# Patient Record
Sex: Female | Born: 1965 | Race: Black or African American | Hispanic: No | Marital: Married
Health system: Southern US, Community
[De-identification: ages and names within clinical notes are randomized; demographics above are authoritative.]

## PROBLEM LIST (undated history)

## (undated) DIAGNOSIS — E119 Type 2 diabetes mellitus without complications: Secondary | ICD-10-CM

## (undated) DIAGNOSIS — I10 Essential (primary) hypertension: Secondary | ICD-10-CM

## (undated) HISTORY — DX: Essential (primary) hypertension: I10

## (undated) HISTORY — DX: Type 2 diabetes mellitus without complications: E11.9

---

## 2003-10-17 ENCOUNTER — Other Ambulatory Visit: Payer: Self-pay

## 2003-11-11 ENCOUNTER — Other Ambulatory Visit: Payer: Self-pay

## 2003-11-14 ENCOUNTER — Other Ambulatory Visit: Payer: Self-pay

## 2003-12-26 ENCOUNTER — Ambulatory Visit: Payer: Self-pay | Admitting: Internal Medicine

## 2004-10-17 ENCOUNTER — Emergency Department: Payer: Self-pay | Admitting: Unknown Physician Specialty

## 2004-10-20 ENCOUNTER — Ambulatory Visit: Payer: Self-pay | Admitting: Unknown Physician Specialty

## 2004-10-28 ENCOUNTER — Inpatient Hospital Stay: Payer: Self-pay

## 2005-05-27 ENCOUNTER — Ambulatory Visit: Payer: Self-pay

## 2005-07-30 ENCOUNTER — Other Ambulatory Visit: Payer: Self-pay

## 2005-07-30 ENCOUNTER — Observation Stay: Payer: Self-pay | Admitting: Internal Medicine

## 2005-09-06 ENCOUNTER — Emergency Department: Payer: Self-pay | Admitting: Unknown Physician Specialty

## 2006-08-24 ENCOUNTER — Emergency Department: Payer: Self-pay | Admitting: Emergency Medicine

## 2006-08-24 ENCOUNTER — Other Ambulatory Visit: Payer: Self-pay

## 2006-10-17 ENCOUNTER — Emergency Department: Payer: Self-pay

## 2006-11-03 ENCOUNTER — Ambulatory Visit: Payer: Self-pay | Admitting: Internal Medicine

## 2006-12-18 ENCOUNTER — Other Ambulatory Visit: Payer: Self-pay

## 2006-12-18 ENCOUNTER — Emergency Department: Payer: Self-pay | Admitting: Emergency Medicine

## 2006-12-23 ENCOUNTER — Ambulatory Visit: Payer: Self-pay | Admitting: Internal Medicine

## 2007-01-06 ENCOUNTER — Ambulatory Visit: Payer: Self-pay | Admitting: Internal Medicine

## 2007-04-27 ENCOUNTER — Ambulatory Visit: Payer: Self-pay | Admitting: Internal Medicine

## 2007-08-09 ENCOUNTER — Ambulatory Visit: Payer: Self-pay | Admitting: Family Medicine

## 2007-12-01 ENCOUNTER — Ambulatory Visit: Payer: Self-pay | Admitting: Internal Medicine

## 2007-12-16 ENCOUNTER — Ambulatory Visit: Payer: Self-pay | Admitting: Family Medicine

## 2008-02-01 ENCOUNTER — Emergency Department: Payer: Self-pay | Admitting: Emergency Medicine

## 2008-08-09 ENCOUNTER — Emergency Department: Payer: Self-pay | Admitting: Emergency Medicine

## 2009-01-10 ENCOUNTER — Ambulatory Visit: Payer: Self-pay | Admitting: Internal Medicine

## 2009-04-30 ENCOUNTER — Ambulatory Visit: Payer: Self-pay | Admitting: Family Medicine

## 2010-02-10 ENCOUNTER — Ambulatory Visit: Payer: Self-pay | Admitting: Internal Medicine

## 2010-02-14 ENCOUNTER — Ambulatory Visit: Payer: Self-pay | Admitting: Family Medicine

## 2010-08-04 ENCOUNTER — Ambulatory Visit: Payer: Self-pay | Admitting: Specialist

## 2010-08-13 ENCOUNTER — Ambulatory Visit: Payer: Self-pay | Admitting: Specialist

## 2010-10-14 DIAGNOSIS — E119 Type 2 diabetes mellitus without complications: Secondary | ICD-10-CM | POA: Insufficient documentation

## 2010-11-04 DIAGNOSIS — I10 Essential (primary) hypertension: Secondary | ICD-10-CM | POA: Insufficient documentation

## 2010-11-04 DIAGNOSIS — K3189 Other diseases of stomach and duodenum: Secondary | ICD-10-CM | POA: Insufficient documentation

## 2010-11-04 DIAGNOSIS — K449 Diaphragmatic hernia without obstruction or gangrene: Secondary | ICD-10-CM | POA: Insufficient documentation

## 2010-11-04 DIAGNOSIS — E782 Mixed hyperlipidemia: Secondary | ICD-10-CM | POA: Insufficient documentation

## 2010-11-04 DIAGNOSIS — F4541 Pain disorder exclusively related to psychological factors: Secondary | ICD-10-CM | POA: Insufficient documentation

## 2010-11-04 DIAGNOSIS — Z8711 Personal history of peptic ulcer disease: Secondary | ICD-10-CM | POA: Insufficient documentation

## 2011-01-27 ENCOUNTER — Ambulatory Visit: Payer: Self-pay | Admitting: Family Medicine

## 2011-12-01 DIAGNOSIS — E669 Obesity, unspecified: Secondary | ICD-10-CM | POA: Insufficient documentation

## 2012-02-09 ENCOUNTER — Ambulatory Visit: Payer: Self-pay | Admitting: Family Medicine

## 2012-05-16 ENCOUNTER — Emergency Department: Payer: Self-pay | Admitting: Emergency Medicine

## 2012-05-24 ENCOUNTER — Ambulatory Visit: Payer: Self-pay | Admitting: Family Medicine

## 2012-06-08 DIAGNOSIS — M25511 Pain in right shoulder: Secondary | ICD-10-CM | POA: Insufficient documentation

## 2012-06-08 DIAGNOSIS — S46911A Strain of unspecified muscle, fascia and tendon at shoulder and upper arm level, right arm, initial encounter: Secondary | ICD-10-CM | POA: Insufficient documentation

## 2012-10-07 ENCOUNTER — Ambulatory Visit: Payer: Self-pay | Admitting: Family Medicine

## 2012-10-14 ENCOUNTER — Ambulatory Visit: Payer: Self-pay | Admitting: Emergency Medicine

## 2013-05-27 ENCOUNTER — Emergency Department: Payer: Self-pay | Admitting: Emergency Medicine

## 2013-05-27 LAB — COMPREHENSIVE METABOLIC PANEL
Albumin: 3.9 g/dL (ref 3.4–5.0)
Alkaline Phosphatase: 115 U/L
Anion Gap: 5 — ABNORMAL LOW (ref 7–16)
BUN: 13 mg/dL (ref 7–18)
Bilirubin,Total: 0.3 mg/dL (ref 0.2–1.0)
Calcium, Total: 9 mg/dL (ref 8.5–10.1)
Chloride: 104 mmol/L (ref 98–107)
Co2: 30 mmol/L (ref 21–32)
Creatinine: 0.9 mg/dL (ref 0.60–1.30)
EGFR (African American): >60
EGFR (Non-African Amer.): >60
Glucose: 152 mg/dL — ABNORMAL HIGH (ref 65–99)
Osmolality: 281 (ref 275–301)
Potassium: 3.5 mmol/L (ref 3.5–5.1)
SGOT(AST): 19 U/L (ref 15–37)
SGPT (ALT): 21 U/L (ref 12–78)
Sodium: 139 mmol/L (ref 136–145)
Total Protein: 8.5 g/dL — ABNORMAL HIGH (ref 6.4–8.2)

## 2013-05-27 LAB — URINALYSIS, COMPLETE
Bacteria: NONE SEEN
Bilirubin,UR: NEGATIVE
Glucose,UR: 50 mg/dL (ref 0–75)
Ketone: NEGATIVE
Leukocyte Esterase: NEGATIVE
Nitrite: NEGATIVE
Ph: 6 (ref 4.5–8.0)
Protein: NEGATIVE
RBC,UR: 1 /HPF (ref 0–5)
Specific Gravity: 1.01 (ref 1.003–1.030)
Squamous Epithelial: 1
WBC UR: <1 /HPF (ref 0–5)

## 2013-05-27 LAB — CBC
HCT: 36 % (ref 35.0–47.0)
HGB: 11.8 g/dL — ABNORMAL LOW (ref 12.0–16.0)
MCH: 26.7 pg (ref 26.0–34.0)
MCHC: 32.7 g/dL (ref 32.0–36.0)
MCV: 81 fL (ref 80–100)
Platelet: 336 10*3/uL (ref 150–440)
RBC: 4.42 10*6/uL (ref 3.80–5.20)
RDW: 15.4 % — ABNORMAL HIGH (ref 11.5–14.5)
WBC: 12.4 10*3/uL — ABNORMAL HIGH (ref 3.6–11.0)

## 2013-05-27 LAB — LIPASE, BLOOD: Lipase: 136 U/L (ref 73–393)

## 2013-12-28 ENCOUNTER — Ambulatory Visit: Payer: Self-pay | Admitting: Family Medicine

## 2014-02-14 DIAGNOSIS — M25774 Osteophyte, right foot: Secondary | ICD-10-CM

## 2014-02-14 DIAGNOSIS — M79671 Pain in right foot: Secondary | ICD-10-CM

## 2014-02-14 DIAGNOSIS — M2042 Other hammer toe(s) (acquired), left foot: Secondary | ICD-10-CM

## 2014-02-14 DIAGNOSIS — M79672 Pain in left foot: Secondary | ICD-10-CM

## 2014-02-14 DIAGNOSIS — M2041 Other hammer toe(s) (acquired), right foot: Secondary | ICD-10-CM

## 2014-02-14 DIAGNOSIS — D492 Neoplasm of unspecified behavior of bone, soft tissue, and skin: Secondary | ICD-10-CM

## 2014-07-25 IMAGING — CT CT ABD-PELV W/ CM
2 of 5 series · 17 of 46 positions shown, 19 images · IV contrast (isovue)
Comparison: 01/27/2011.

CLINICAL DATA: Abdominal pain with vomiting and diarrhea.

EXAM:
CT ABDOMEN AND PELVIS WITH CONTRAST
TECHNIQUE: Multidetector CT imaging of the abdomen and pelvis was performed
using the standard protocol following bolus administration of
intravenous contrast.
CONTRAST:  125 cc Isovue 370 intravenous

[Series 2: routine abd pel with · axial · 0.69mm/px · z∈[-929,-539]mm · 14 of 88 slices shown, 16 images]
[im 5/88  soft-tissue]
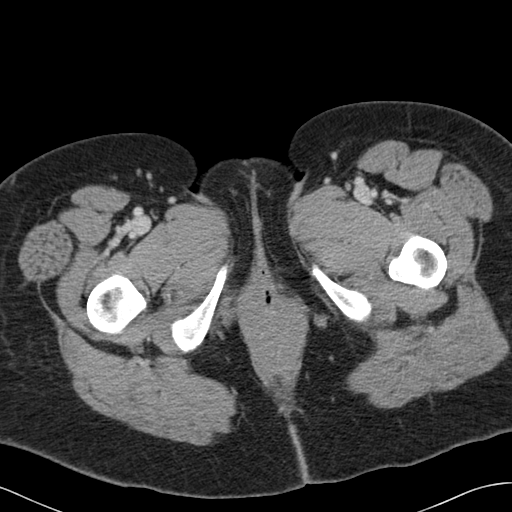
[im 5/88  bone]
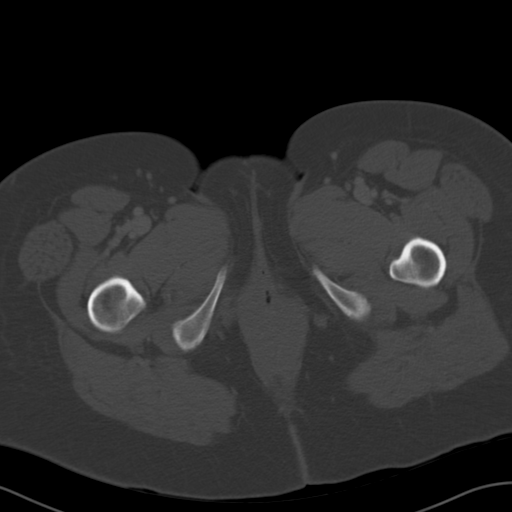
[im 10/88  soft-tissue]
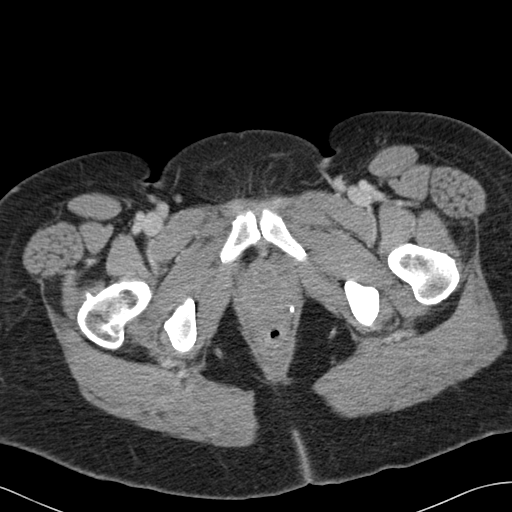
[im 19/88  soft-tissue]
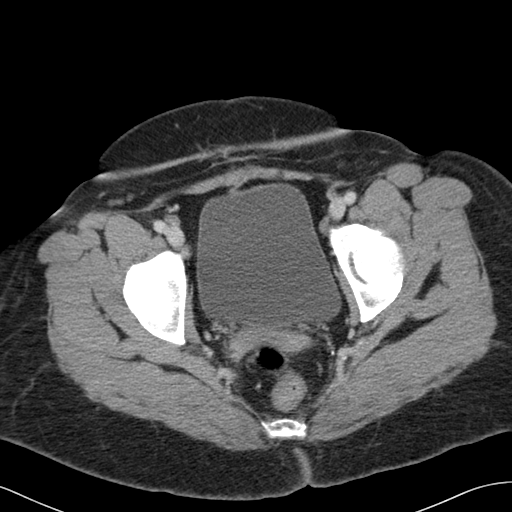
[im 23/88  soft-tissue]
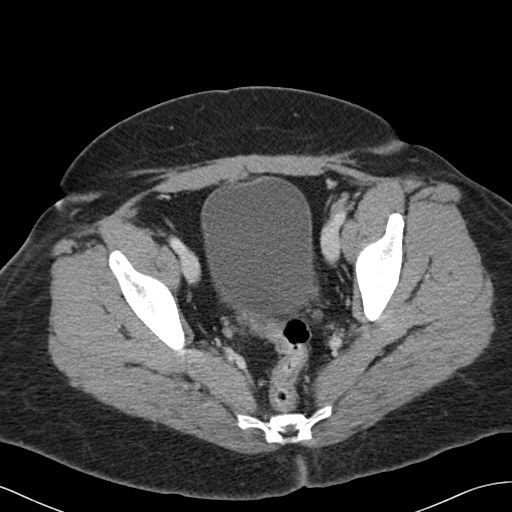
[im 28/88  soft-tissue]
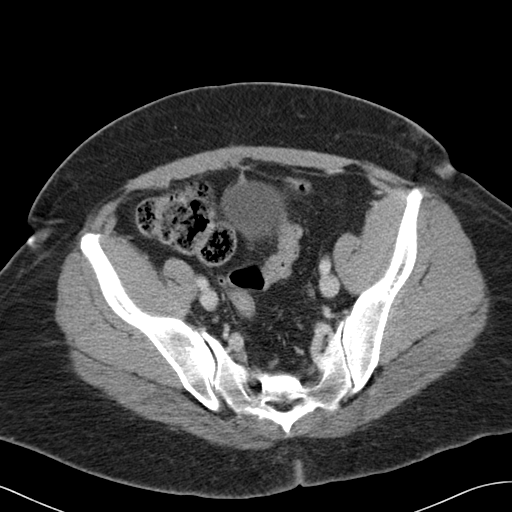
[im 37/88  soft-tissue]
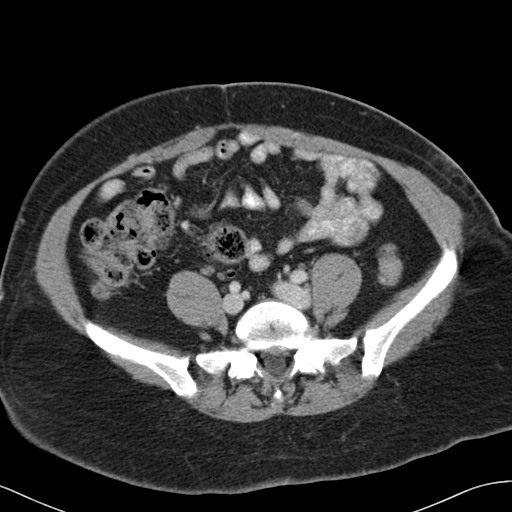
[im 42/88  soft-tissue]
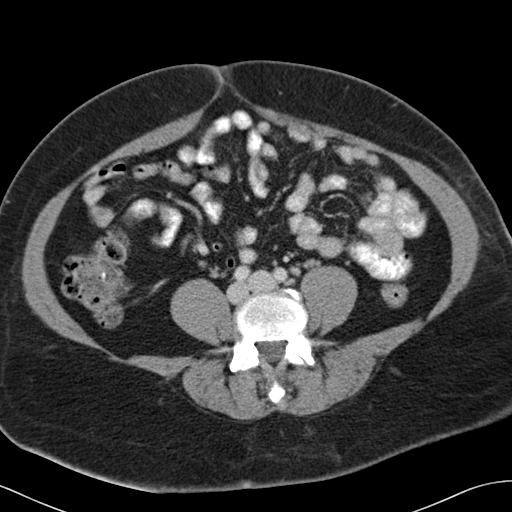
[im 46/88  soft-tissue]
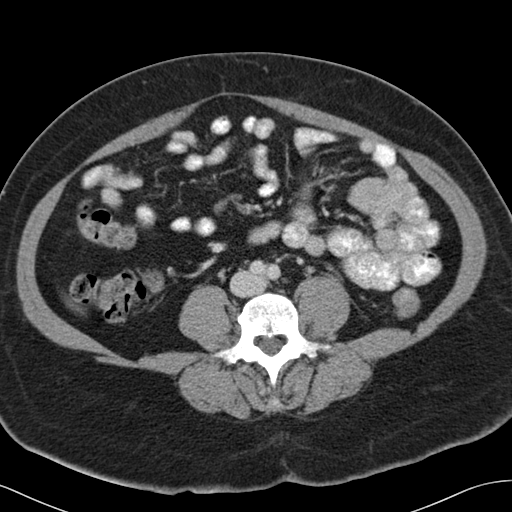
[im 51/88  soft-tissue]
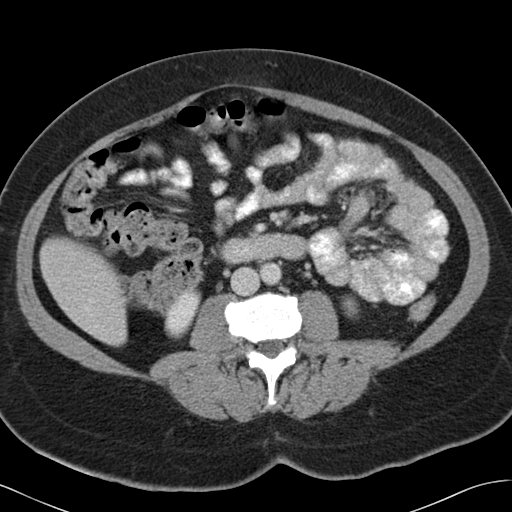
[im 51/88  bone]
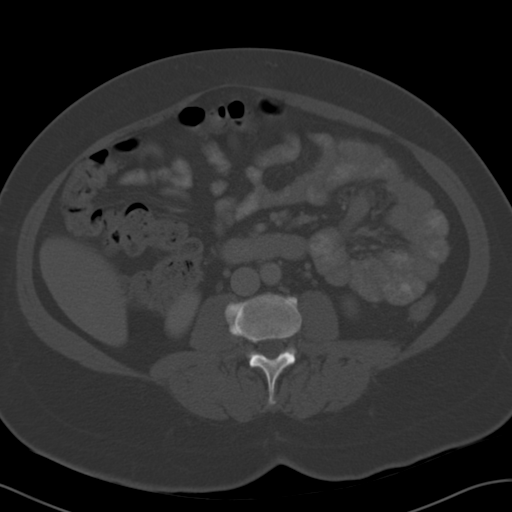
[im 60/88  soft-tissue]
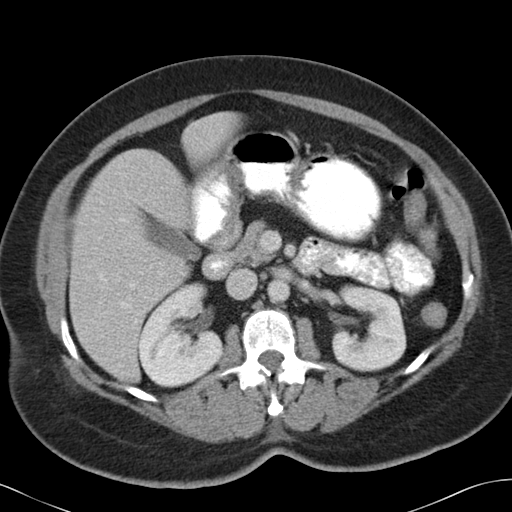
[im 65/88  soft-tissue]
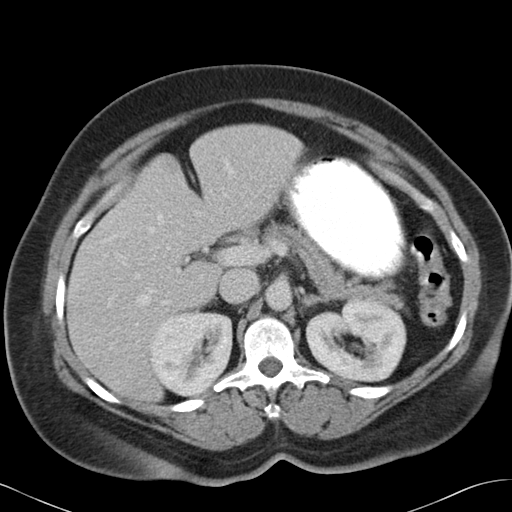
[im 69/88  soft-tissue]
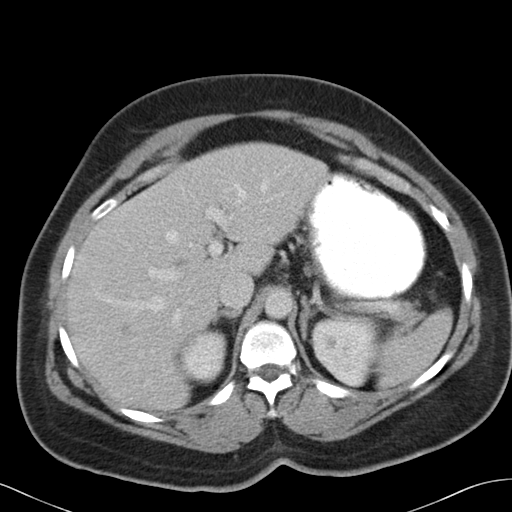
[im 78/88  soft-tissue]
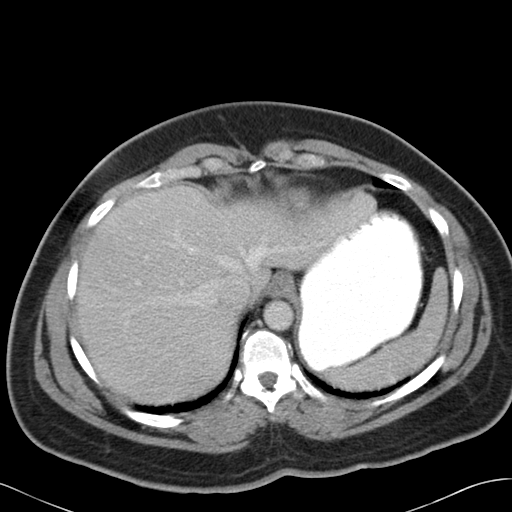
[im 83/88  soft-tissue]
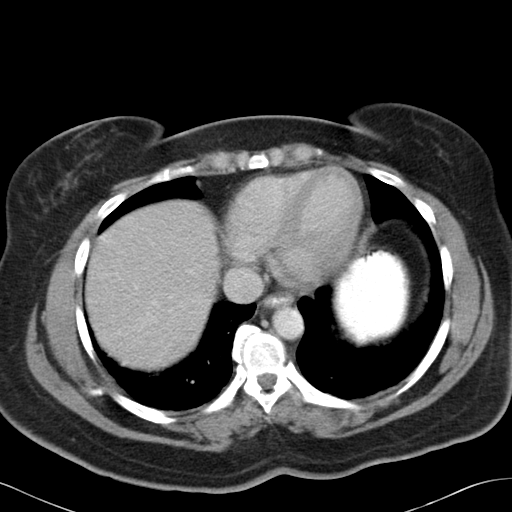

[Series 6: cor routine abd pel with · coronal · 0.75mm/px · 3 of 150 slices shown]
[im 50/150  soft-tissue]
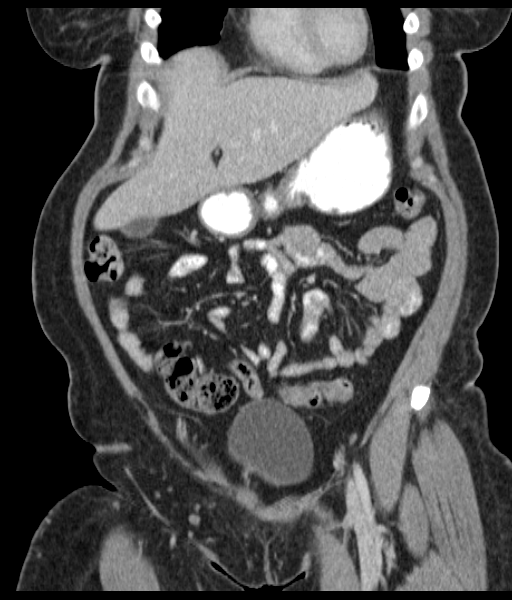
[im 67/150  soft-tissue]
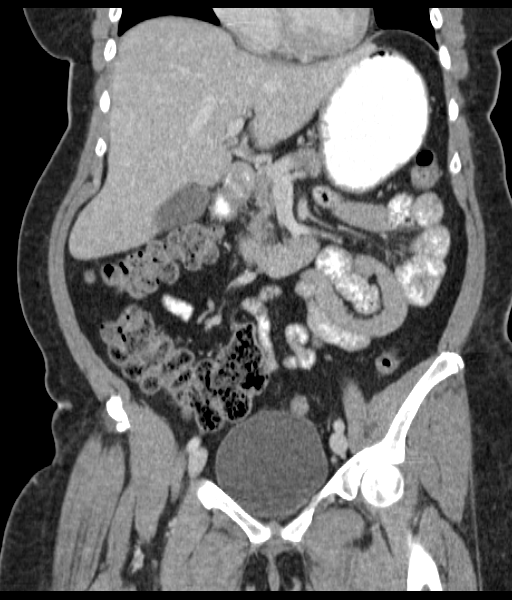
[im 83/150  soft-tissue]
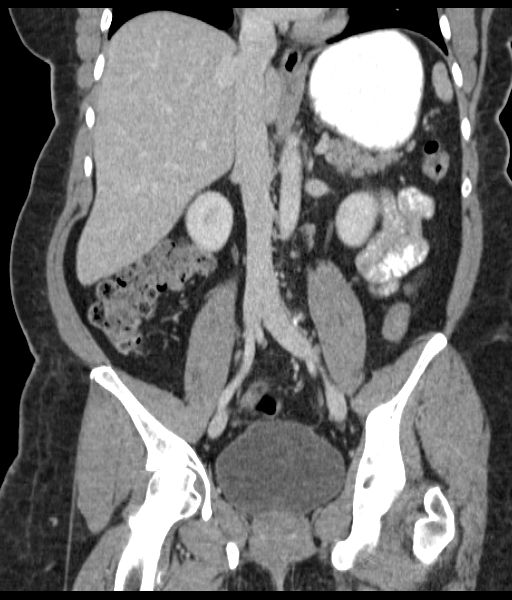

[17 of 46 positions shown; findings below may reference images not displayed]

FINDINGS: BODY WALL: Unremarkable.

LOWER CHEST: 3-4 mm subpleural pulmonary nodule at the peripheral
left base. Given slice selection, this was likely present and
unchanged in 8658.

ABDOMEN/PELVIS:

Liver: A faint, ill-defined nodule in the central right liver (image
20) measuring 8 mm is unchanged from 8658. Stability consistent with
benign finding, often hemangioma.

Biliary: No evidence of biliary obstruction or stone.

Pancreas: Unremarkable.

Spleen: Unremarkable.

Adrenals: Unremarkable.

Kidneys and ureters: No hydronephrosis or stone.

Bladder: Unremarkable.

Reproductive: Hysterectomy.

Bowel: No obstruction. Normal appendix.

Retroperitoneum: Unchanged enlargement of a external iliac node on
the right, 14 mm in diameter. This is likely from remote
inflammation given stability.

Peritoneum: No free fluid or gas.

Vascular: No acute abnormality.

OSSEOUS: No acute abnormalities.
IMPRESSION: No acute intra-abdominal abnormality.

## 2015-09-06 DIAGNOSIS — G5603 Carpal tunnel syndrome, bilateral upper limbs: Secondary | ICD-10-CM | POA: Insufficient documentation

## 2015-12-05 ENCOUNTER — Other Ambulatory Visit: Payer: Self-pay | Admitting: Family Medicine

## 2015-12-05 DIAGNOSIS — Z1231 Encounter for screening mammogram for malignant neoplasm of breast: Secondary | ICD-10-CM

## 2015-12-17 ENCOUNTER — Ambulatory Visit: Admission: RE | Admit: 2015-12-17 | Payer: Self-pay | Source: Ambulatory Visit

## 2016-06-09 DIAGNOSIS — R399 Unspecified symptoms and signs involving the genitourinary system: Secondary | ICD-10-CM | POA: Insufficient documentation

## 2016-06-09 DIAGNOSIS — R109 Unspecified abdominal pain: Secondary | ICD-10-CM | POA: Insufficient documentation

## 2016-06-09 DIAGNOSIS — R7309 Other abnormal glucose: Secondary | ICD-10-CM | POA: Insufficient documentation

## 2016-07-06 DIAGNOSIS — M170 Bilateral primary osteoarthritis of knee: Secondary | ICD-10-CM | POA: Insufficient documentation

## 2017-08-18 ENCOUNTER — Encounter: Payer: Self-pay | Admitting: Endocrinology

## 2019-04-03 ENCOUNTER — Ambulatory Visit: Attending: Family Medicine | Primary: Family Medicine

## 2019-04-17 ENCOUNTER — Ambulatory Visit: Payer: PRIVATE HEALTH INSURANCE | Attending: Family Medicine | Primary: Family Medicine

## 2019-04-17 NOTE — Progress Notes (Signed)
At 254 today I attempted to reach pt for her  VV with Dr. Melchor Amour pt's phone went straight to VM. LVM for pt to [facility] to office. Will re-attempt shortly.

## 2019-04-17 NOTE — Progress Notes (Signed)
At 240 today I attempted to reach pt for her VV with Dr. Melchor Amour pt's phone went straight to VM. LVM for pt to [facility] to office. Will re-attempt shortly.

## 2019-04-17 NOTE — Progress Notes (Signed)
At 254 today I attempted to reach pt for her  VV with Dr. Kluball pt's phone went straight to VM. LVM for pt to [facility] to office. Will re-attempt shortly.

## 2019-04-17 NOTE — Progress Notes (Signed)
At 240 today I attempted to reach pt for her VV with Dr. Kluball pt's phone went straight to VM. LVM for pt to [facility] to office. Will re-attempt shortly.

## 2020-04-10 ENCOUNTER — Other Ambulatory Visit: Payer: Self-pay | Admitting: Family Medicine

## 2020-04-10 DIAGNOSIS — Z1231 Encounter for screening mammogram for malignant neoplasm of breast: Secondary | ICD-10-CM

## 2020-04-15 ENCOUNTER — Other Ambulatory Visit: Payer: Self-pay

## 2020-04-15 ENCOUNTER — Telehealth (INDEPENDENT_AMBULATORY_CARE_PROVIDER_SITE_OTHER): Payer: Self-pay | Admitting: Gastroenterology

## 2020-04-15 DIAGNOSIS — I10 Essential (primary) hypertension: Secondary | ICD-10-CM | POA: Insufficient documentation

## 2020-04-15 DIAGNOSIS — Z1211 Encounter for screening for malignant neoplasm of colon: Secondary | ICD-10-CM

## 2020-04-15 MED ORDER — PEG 3350-KCL-NA BICARB-NACL 420 G PO SOLR: 4000.0000 mL | Freq: Once | ORAL | 0 refills | Status: AC

## 2020-04-15 NOTE — Progress Notes (Signed)
Gastroenterology Pre-Procedure Review  Request Date: Friday 05/10/20 Requesting Physician: Dr. Bonna Gains  PATIENT REVIEW QUESTIONS: The patient responded to the following health history questions as indicated:    1. Are you having any GI issues? yes (heartburn) 2. Do you have a personal history of Polyps? yes (pt states her last colonoscopy was 10-15 years ago) 3. Do you have a family history of Colon Cancer or Polyps? no 4. Diabetes Mellitus? yes (type 2) 5. Joint replacements in the past 12 months?no 6. Major health problems in the past 3 months?no 7. Any artificial heart valves, MVP, or defibrillator?no    MEDICATIONS & ALLERGIES:    Patient reports the following regarding taking any anticoagulation/antiplatelet therapy:   Plavix, Coumadin, Eliquis, Xarelto, Lovenox, Pradaxa, Brilinta, or Effient? no Aspirin? no  Patient confirms/reports the following medications:  Current Outpatient Medications  Medication Sig Dispense Refill  . Blood Glucose Monitoring Suppl (FIFTY50 GLUCOSE METER 2.0) w/Device KIT See admin instructions.    Marland Kitchen CARDIZEM CD 240 MG 24 hr capsule Take 240 mg by mouth daily.    Marland Kitchen esomeprazole (NEXIUM) 40 MG capsule Take by mouth.    Marland Kitchen HUMALOG KWIKPEN 100 UNIT/ML KwikPen SMARTSIG:10 Unit(s) SUB-Q 3 Times Daily    . hydrochlorothiazide (HYDRODIURIL) 12.5 MG tablet Take by mouth.    Marland Kitchen ibuprofen (ADVIL) 800 MG tablet Take 800 mg by mouth 3 (three) times daily as needed.    . Insulin Aspart FlexPen 100 UNIT/ML SOPN SMARTSIG:10 Unit(s) SUB-Q    . insulin aspart protamine- aspart (NOVOLOG MIX 70/30) (70-30) 100 UNIT/ML injection Inject into the skin.    . Insulin Pen Needle (B-D ULTRAFINE III SHORT PEN) 31G X 8 MM MISC Inject insulin qac and qhs    . Ipratropium-Albuterol (COMBIVENT RESPIMAT) 20-100 MCG/ACT AERS respimat INHALE 1 INHALATION INTO THE LUNGS FOUR TIMES DAILY AS NEEDED FOR WHEEZING    . lisinopril-hydrochlorothiazide (ZESTORETIC) 20-12.5 MG tablet Take 1 tablet  by mouth daily.    . ondansetron (ZOFRAN-ODT) 4 MG disintegrating tablet Take by mouth.    . polyethylene glycol-electrolytes (NULYTELY) 420 g solution Take 4,000 mLs by mouth once for 1 dose. 4000 mL 0  . tamsulosin (FLOMAX) 0.4 MG CAPS capsule Take by mouth.    . topiramate (TOPAMAX) 50 MG tablet TAKE 1 TABLET BY MOUTH TWICE DAILY. NEED APPT FOR MORE REFILLS PER MD    . traMADol (ULTRAM) 50 MG tablet      No current facility-administered medications for this visit.    Patient confirms/reports the following allergies:  Allergies  Allergen Reactions  . Oxycodone-Acetaminophen Hives  . Morphine   . Sulfa Antibiotics Other (See Comments)    Other reaction(s): Blood Disorder   . Vancomycin Itching    No orders of the defined types were placed in this encounter.   AUTHORIZATION INFORMATION Primary Insurance: 1D#: Group #:  Secondary Insurance: 1D#: Group #:  SCHEDULE INFORMATION: Date:05/10/20  Time: Location:[facility]

## 2020-05-08 ENCOUNTER — Other Ambulatory Visit: Admission: RE | Admit: 2020-05-08 | Payer: Self-pay | Source: Ambulatory Visit

## 2020-05-09 ENCOUNTER — Telehealth: Payer: Self-pay

## 2020-05-09 NOTE — Telephone Encounter (Signed)
Called patient to inform her procedure has been cancelled. Pt needed to cancel because of family emergency. Endo unit has been notified.

## 2020-05-10 ENCOUNTER — Encounter: Admission: RE | Payer: Self-pay | Source: Home / Self Care

## 2020-05-10 ENCOUNTER — Ambulatory Visit
Admission: RE | Admit: 2020-05-10 | Payer: BC Managed Care – PPO | Source: Home / Self Care | Admitting: Gastroenterology

## 2020-05-10 SURGERY — COLONOSCOPY WITH PROPOFOL
Anesthesia: General

## 2020-09-23 ENCOUNTER — Emergency Department (HOSPITAL_COMMUNITY): Payer: Self-pay

## 2020-09-23 ENCOUNTER — Observation Stay (HOSPITAL_COMMUNITY)
Admission: EM | Admit: 2020-09-23 | Discharge: 2020-09-24 | Disposition: A | Discharge status: Home / Self Care | Payer: Self-pay | Attending: Internal Medicine | Admitting: Internal Medicine

## 2020-09-23 ENCOUNTER — Other Ambulatory Visit: Payer: Self-pay

## 2020-09-23 ENCOUNTER — Encounter (HOSPITAL_COMMUNITY): Payer: Self-pay

## 2020-09-23 DIAGNOSIS — E119 Type 2 diabetes mellitus without complications: Secondary | ICD-10-CM | POA: Insufficient documentation

## 2020-09-23 DIAGNOSIS — Y92009 Unspecified place in unspecified non-institutional (private) residence as the place of occurrence of the external cause: Secondary | ICD-10-CM | POA: Insufficient documentation

## 2020-09-23 DIAGNOSIS — R739 Hyperglycemia, unspecified: Secondary | ICD-10-CM

## 2020-09-23 DIAGNOSIS — R413 Other amnesia: Secondary | ICD-10-CM

## 2020-09-23 DIAGNOSIS — L0211 Cutaneous abscess of neck: Secondary | ICD-10-CM | POA: Insufficient documentation

## 2020-09-23 DIAGNOSIS — S060X9A Concussion with loss of consciousness of unspecified duration, initial encounter: Secondary | ICD-10-CM

## 2020-09-23 DIAGNOSIS — R748 Abnormal levels of other serum enzymes: Secondary | ICD-10-CM | POA: Insufficient documentation

## 2020-09-23 DIAGNOSIS — S0990XA Unspecified injury of head, initial encounter: Principal | ICD-10-CM | POA: Insufficient documentation

## 2020-09-23 DIAGNOSIS — Z794 Long term (current) use of insulin: Secondary | ICD-10-CM | POA: Insufficient documentation

## 2020-09-23 DIAGNOSIS — R55 Syncope and collapse: Secondary | ICD-10-CM | POA: Insufficient documentation

## 2020-09-23 DIAGNOSIS — I10 Essential (primary) hypertension: Secondary | ICD-10-CM | POA: Diagnosis present

## 2020-09-23 DIAGNOSIS — E1169 Type 2 diabetes mellitus with other specified complication: Secondary | ICD-10-CM

## 2020-09-23 DIAGNOSIS — W19XXXA Unspecified fall, initial encounter: Secondary | ICD-10-CM | POA: Diagnosis present

## 2020-09-23 DIAGNOSIS — Z20822 Contact with and (suspected) exposure to covid-19: Secondary | ICD-10-CM | POA: Insufficient documentation

## 2020-09-23 DIAGNOSIS — E1165 Type 2 diabetes mellitus with hyperglycemia: Secondary | ICD-10-CM

## 2020-09-23 DIAGNOSIS — Z79899 Other long term (current) drug therapy: Secondary | ICD-10-CM | POA: Insufficient documentation

## 2020-09-23 DIAGNOSIS — IMO0002 Reserved for concepts with insufficient information to code with codable children: Secondary | ICD-10-CM

## 2020-09-23 DIAGNOSIS — W1642XA Fall into unspecified water causing other injury, initial encounter: Secondary | ICD-10-CM | POA: Insufficient documentation

## 2020-09-23 DIAGNOSIS — S0191XA Laceration without foreign body of unspecified part of head, initial encounter: Secondary | ICD-10-CM | POA: Insufficient documentation

## 2020-09-23 LAB — RESP PANEL BY RT-PCR (FLU A&B, COVID) ARPGX2
Influenza A by PCR: NEGATIVE
Influenza B by PCR: NEGATIVE
SARS Coronavirus 2 by RT PCR: NEGATIVE

## 2020-09-23 LAB — URINALYSIS, ROUTINE W REFLEX MICROSCOPIC
Bilirubin Urine: NEGATIVE
Glucose, UA: >=500 mg/dL — AB
Hgb urine dipstick: NEGATIVE
Ketones, ur: NEGATIVE mg/dL
Leukocytes,Ua: NEGATIVE
Nitrite: POSITIVE — AB
Protein, ur: NEGATIVE mg/dL
Specific Gravity, Urine: 1.022 (ref 1.005–1.030)
pH: 8 (ref 5.0–8.0)

## 2020-09-23 LAB — CBC WITH DIFFERENTIAL/PLATELET
Abs Immature Granulocytes: 0.06 10*3/uL (ref 0.00–0.07)
Basophils Absolute: 0 10*3/uL (ref 0.0–0.1)
Basophils Relative: 1 %
Eosinophils Absolute: 0.1 10*3/uL (ref 0.0–0.5)
Eosinophils Relative: 1 %
HCT: 35.1 % — ABNORMAL LOW (ref 36.0–46.0)
Hemoglobin: 11.3 g/dL — ABNORMAL LOW (ref 12.0–15.0)
Immature Granulocytes: 1 %
Lymphocytes Relative: 29 %
Lymphs Abs: 2.5 10*3/uL (ref 0.7–4.0)
MCH: 27.8 pg (ref 26.0–34.0)
MCHC: 32.2 g/dL (ref 30.0–36.0)
MCV: 86.5 fL (ref 80.0–100.0)
Monocytes Absolute: 0.4 10*3/uL (ref 0.1–1.0)
Monocytes Relative: 5 %
Neutro Abs: 5.6 10*3/uL (ref 1.7–7.7)
Neutrophils Relative %: 63 %
Platelets: 327 10*3/uL (ref 150–400)
RBC: 4.06 MIL/uL (ref 3.87–5.11)
RDW: 13.1 % (ref 11.5–15.5)
WBC: 8.7 10*3/uL (ref 4.0–10.5)
nRBC: 0 % (ref 0.0–0.2)

## 2020-09-23 LAB — COMPREHENSIVE METABOLIC PANEL
ALT: 13 U/L (ref 0–44)
AST: 13 U/L — ABNORMAL LOW (ref 15–41)
Albumin: 3.5 g/dL (ref 3.5–5.0)
Alkaline Phosphatase: 77 U/L (ref 38–126)
Anion gap: 7 (ref 5–15)
BUN: 9 mg/dL (ref 6–20)
CO2: 28 mmol/L (ref 22–32)
Calcium: 8.8 mg/dL — ABNORMAL LOW (ref 8.9–10.3)
Chloride: 101 mmol/L (ref 98–111)
Creatinine, Ser: 0.64 mg/dL (ref 0.44–1.00)
GFR, Estimated: >60 mL/min (ref >60)
Glucose, Bld: 377 mg/dL — ABNORMAL HIGH (ref 70–99)
Potassium: 3.9 mmol/L (ref 3.5–5.1)
Sodium: 136 mmol/L (ref 135–145)
Total Bilirubin: 0.7 mg/dL (ref 0.3–1.2)
Total Protein: 7.1 g/dL (ref 6.5–8.1)

## 2020-09-23 LAB — RAPID URINE DRUG SCREEN, HOSP PERFORMED
Amphetamines: NOT DETECTED
Barbiturates: NOT DETECTED
Benzodiazepines: NOT DETECTED
Cocaine: NOT DETECTED
Opiates: NOT DETECTED
Tetrahydrocannabinol: NOT DETECTED

## 2020-09-23 LAB — TROPONIN I (HIGH SENSITIVITY)
Troponin I (High Sensitivity): 10 ng/L (ref <18)
Troponin I (High Sensitivity): 18 ng/L — ABNORMAL HIGH (ref <18)

## 2020-09-23 LAB — CBG MONITORING, ED: Glucose-Capillary: 301 mg/dL — ABNORMAL HIGH (ref 70–99)

## 2020-09-23 MED ORDER — POLYETHYLENE GLYCOL 3350 17 G PO PACK: 17.0000 g | PACK | Freq: Every day | ORAL | Status: DC | PRN

## 2020-09-23 MED ORDER — TOPIRAMATE 25 MG PO TABS: 25.0000 mg | ORAL_TABLET | Freq: Two times a day (BID) | ORAL | Status: DC

## 2020-09-23 MED ORDER — ENOXAPARIN SODIUM 40 MG/0.4ML IJ SOSY
40.0000 mg | PREFILLED_SYRINGE | INTRAMUSCULAR | Status: DC
  Administered 2020-09-23: 40 mg via SUBCUTANEOUS
  Filled 2020-09-23: qty 0.4

## 2020-09-23 MED ORDER — TRAMADOL HCL 50 MG PO TABS
50.0000 mg | ORAL_TABLET | Freq: Four times a day (QID) | ORAL | Status: DC | PRN
  Administered 2020-09-23 – 2020-09-24 (×2): 50 mg via ORAL
  Filled 2020-09-23 (×2): qty 1

## 2020-09-23 MED ORDER — DILTIAZEM HCL ER COATED BEADS 120 MG PO CP24
120.0000 mg | ORAL_CAPSULE | Freq: Every day | ORAL | Status: DC
  Administered 2020-09-23 – 2020-09-24 (×2): 120 mg via ORAL
  Filled 2020-09-23 (×2): qty 1

## 2020-09-23 MED ORDER — IBUPROFEN 100 MG PO CHEW
100.0000 mg | CHEWABLE_TABLET | Freq: Three times a day (TID) | ORAL | Status: DC | PRN
  Filled 2020-09-23: qty 1

## 2020-09-23 MED ORDER — LISINOPRIL-HYDROCHLOROTHIAZIDE 20-12.5 MG PO TABS: 1.0000 | ORAL_TABLET | Freq: Every day | ORAL | Status: DC

## 2020-09-23 MED ORDER — ACETAMINOPHEN 325 MG PO TABS: 650.0000 mg | ORAL_TABLET | Freq: Four times a day (QID) | ORAL | Status: DC | PRN

## 2020-09-23 MED ORDER — SODIUM CHLORIDE 0.9 % IV SOLN
1.0000 g | INTRAVENOUS | Status: DC
  Administered 2020-09-23: 1 g via INTRAVENOUS
  Filled 2020-09-23 (×2): qty 10

## 2020-09-23 MED ORDER — ONDANSETRON HCL 4 MG/2ML IJ SOLN: 4.0000 mg | Freq: Four times a day (QID) | INTRAMUSCULAR | Status: DC | PRN

## 2020-09-23 MED ORDER — IBUPROFEN 200 MG PO TABS
100.0000 mg | ORAL_TABLET | Freq: Three times a day (TID) | ORAL | Status: DC | PRN
  Administered 2020-09-23 – 2020-09-24 (×2): 100 mg via ORAL
  Filled 2020-09-23 (×2): qty 1

## 2020-09-23 MED ORDER — HYDROCHLOROTHIAZIDE 12.5 MG PO CAPS: 12.5000 mg | ORAL_CAPSULE | Freq: Every day | ORAL | Status: DC

## 2020-09-23 MED ORDER — KETOROLAC TROMETHAMINE 30 MG/ML IJ SOLN
15.0000 mg | Freq: Once | INTRAMUSCULAR | Status: AC
  Administered 2020-09-23: 15 mg via INTRAVENOUS
  Filled 2020-09-23: qty 1

## 2020-09-23 MED ORDER — INSULIN ASPART 100 UNIT/ML IJ SOLN
0.0000 [IU] | Freq: Three times a day (TID) | INTRAMUSCULAR | Status: DC
  Administered 2020-09-24 (×2): 3 [IU] via SUBCUTANEOUS

## 2020-09-23 MED ORDER — LACTATED RINGERS IV SOLN: INTRAVENOUS | Status: DC

## 2020-09-23 MED ORDER — INSULIN GLARGINE 100 UNIT/ML ~~LOC~~ SOLN
10.0000 [IU] | Freq: Every day | SUBCUTANEOUS | Status: DC
  Administered 2020-09-24: 10 [IU] via SUBCUTANEOUS
  Filled 2020-09-23 (×2): qty 0.1

## 2020-09-23 MED ORDER — METOPROLOL TARTRATE 5 MG/5ML IV SOLN: 2.5000 mg | Freq: Four times a day (QID) | INTRAVENOUS | Status: DC | PRN

## 2020-09-23 MED ORDER — INSULIN ASPART 100 UNIT/ML IJ SOLN
0.0000 [IU] | Freq: Every day | INTRAMUSCULAR | Status: DC
  Administered 2020-09-23: 4 [IU] via SUBCUTANEOUS

## 2020-09-23 MED ORDER — MELATONIN 3 MG PO TABS
3.0000 mg | ORAL_TABLET | Freq: Every evening | ORAL | Status: DC | PRN
  Administered 2020-09-23: 3 mg via ORAL
  Filled 2020-09-23: qty 1

## 2020-09-23 MED ORDER — MORPHINE SULFATE (PF) 2 MG/ML IV SOLN: 2.0000 mg | INTRAVENOUS | Status: DC | PRN

## 2020-09-23 MED ORDER — SODIUM CHLORIDE 0.9 % IV BOLUS
500.0000 mL | Freq: Once | INTRAVENOUS | Status: AC
  Administered 2020-09-23: 500 mL via INTRAVENOUS

## 2020-09-23 MED ORDER — DILTIAZEM HCL ER COATED BEADS 240 MG PO CP24: 240.0000 mg | ORAL_CAPSULE | Freq: Every day | ORAL | Status: DC

## 2020-09-23 MED ORDER — LISINOPRIL 20 MG PO TABS
20.0000 mg | ORAL_TABLET | Freq: Every day | ORAL | Status: DC
  Administered 2020-09-23 – 2020-09-24 (×2): 20 mg via ORAL
  Filled 2020-09-23 (×2): qty 1

## 2020-09-23 MED ORDER — OXYCODONE HCL 5 MG PO TABS: 5.0000 mg | ORAL_TABLET | Freq: Four times a day (QID) | ORAL | Status: DC | PRN

## 2020-09-23 NOTE — ED Notes (Signed)
Patient transported to CT 

## 2020-09-23 NOTE — ED Notes (Addendum)
She CAN tell me her name and what season we are in. She cannot tell me what happened today, her home address, how tall she is, how much she weighs, or where she is. After telling her that she is in the hospital and then asking again where she was she was able to say that she is in the hospital.

## 2020-09-23 NOTE — ED Triage Notes (Signed)
BIB EMS from home after a fall today, she cannot remember anything after the fall, unknown last known well time. C/o nausea and vomiting and head pain.

## 2020-09-23 NOTE — ED Notes (Signed)
Pt c/o headache at this time and is asking for something stronger for the pain. Message sent to the provider

## 2020-09-23 NOTE — H&P (Addendum)
History and Physical  Claire Carrillo DXI:[id number] DOB: October 30, 1965 DOA: 09/23/2020  Referring physician: Dr. Alvino Chapel, [place] PCP: Marguerita Merles, MD  Outpatient Specialists: Nephrology Patient coming from: Home.   Chief Complaint: Fall  HPI: Claire Carrillo is a 55 y.o. female with medical history significant for hypertension, GERD, type 2 diabetes, migraines, neck abscess status post incision and drainage by general surgery in 2020, who presented to [facility] ED from home after a fall.  She reports she slipped on water, fell and landed on the back of her head.  She is alert and oriented x 4 at the time of this visit.  The fall was unwitnessed.  She was in her usual state of health prior to this.  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  She lives with a roommate.  In the ED she reports having a headache.  CT head and cervical spine nonacute.  EDP requested admission for observation.  UA positive for nitrite.  CXR non acute.  ED Course:  Temperature 98.2.  BP 142/79, pulse 102, respiratory 21.  Oxygen saturation 98% on room air.  Lab studies remarkable for serum bicarb 28, glucose 377, BUN 9, creatinine 0.64, GFR greater than 60.  WBC 8.7, hemoglobin 11.3, MCV 86, platelet count 327.  UA positive for glucosuria and nitrite.  Review of Systems: Review of systems as noted in the HPI. All other systems reviewed and are negative.  Past medical history Essential hypertension GERD, type 2 diabetes Migraines Neck abscess status post I&D in 2020  Past surgical history: Incision and drainage of neck abscess in 2020  Social History:  has no history on file for tobacco use, alcohol use, and drug use.   Allergies  Allergen Reactions   Oxycodone-Acetaminophen Hives   Morphine    Sulfa Antibiotics Other (See Comments)    Other reaction(s): Blood Disorder    Vancomycin Itching    Family history: Mother with history of type 2 diabetes and  hypertension Father with history of type 2 diabetes and hypertension.  Prior to Admission medications   Medication Sig Start Date End Date Taking? Authorizing Provider  Blood Glucose Monitoring Suppl (FIFTY50 GLUCOSE METER 2.0) w/Device KIT See admin instructions. 12/18/15   [provider]  CARDIZEM CD 240 MG 24 hr capsule Take 240 mg by mouth daily. 04/09/20   [provider]  esomeprazole (NEXIUM) 40 MG capsule Take by mouth. 05/06/17   [provider]  HUMALOG KWIKPEN 100 UNIT/ML KwikPen SMARTSIG:10 Unit(s) SUB-Q 3 Times Daily 04/09/20   [provider]  hydrochlorothiazide (HYDRODIURIL) 12.5 MG tablet Take by mouth.    [provider]  ibuprofen (ADVIL) 800 MG tablet Take 800 mg by mouth 3 (three) times daily as needed. 11/28/19   [provider]  Insulin Aspart FlexPen 100 UNIT/ML SOPN SMARTSIG:10 Unit(s) SUB-Q 01/02/20   [provider]  insulin aspart protamine- aspart (NOVOLOG MIX 70/30) (70-30) 100 UNIT/ML injection Inject into the skin. 05/02/18   [provider]  Insulin Pen Needle (B-D ULTRAFINE III SHORT PEN) 31G X 8 MM MISC Inject insulin qac and qhs 07/15/16   [provider]  Ipratropium-Albuterol (COMBIVENT RESPIMAT) 20-100 MCG/ACT AERS respimat INHALE 1 INHALATION INTO THE LUNGS FOUR TIMES DAILY AS NEEDED FOR WHEEZING 07/12/17   [provider]  lisinopril-hydrochlorothiazide (ZESTORETIC) 20-12.5 MG tablet Take 1 tablet by mouth daily. 04/09/20   [provider]  ondansetron (ZOFRAN-ODT) 4 MG disintegrating tablet  Take by mouth. 08/19/17   [provider]  tamsulosin (FLOMAX) 0.4 MG CAPS capsule Take by mouth. 05/06/17   [provider]  topiramate (TOPAMAX) 50 MG tablet TAKE 1 TABLET BY MOUTH TWICE DAILY. NEED APPT FOR MORE REFILLS PER MD 06/29/17   [provider]  traMADol Veatrice Bourbon) 50 MG tablet  02/14/14   [provider]    Physical Exam: BP (!) 142/79    Pulse (!) 102   Temp 98.2 F (36.8 C) (Oral)   Resp (!) 21   Ht 5' 6"  (1.676 m)   Wt 90.7 kg   SpO2 98%   BMI 32.28 kg/m   General: 55 y.o. year-old female well developed well nourished in no acute distress.  Alert and oriented x3. Cardiovascular: Regular rate and rhythm with no rubs or gallops.  No thyromegaly or JVD noted.  No lower extremity edema. 2/4 pulses in all 4 extremities. Respiratory: Clear to auscultation with no wheezes or rales. Good inspiratory effort. Abdomen: Soft nontender nondistended with normal bowel sounds x4 quadrants. Muskuloskeletal: No cyanosis, clubbing or edema noted bilaterally Neuro: CN II-XII intact, strength, sensation, reflexes Skin: No ulcerative lesions noted or rashes Psychiatry: Judgement and insight appear normal. Mood is appropriate for condition and setting          Labs on Admission:  Basic Metabolic Panel: Recent Labs  Lab 09/23/20 1548  NA 136  K 3.9  CL 101  CO2 28  GLUCOSE 377*  BUN 9  CREATININE 0.64  CALCIUM 8.8*   Liver Function Tests: Recent Labs  Lab 09/23/20 1548  AST 13*  ALT 13  ALKPHOS 77  BILITOT 0.7  PROT 7.1  ALBUMIN 3.5   No results for input(s): LIPASE, AMYLASE in the last 168 hours. No results for input(s): AMMONIA in the last 168 hours. CBC: Recent Labs  Lab 09/23/20 1548  WBC 8.7  NEUTROABS 5.6  HGB 11.3*  HCT 35.1*  MCV 86.5  PLT 327   Cardiac Enzymes: No results for input(s): CKTOTAL, CKMB, CKMBINDEX, TROPONINI in the last 168 hours.  BNP (last 3 results) No results for input(s): BNP in the last 8760 hours.  ProBNP (last 3 results) No results for input(s): PROBNP in the last 8760 hours.  CBG: No results for input(s): GLUCAP in the last 168 hours.  Radiological Exams on Admission: CT Head Wo Contrast  Result Date: 09/23/2020 CLINICAL DATA:  Fall, altered mental status, midline cervical tenderness EXAM: CT HEAD WITHOUT CONTRAST CT CERVICAL SPINE WITHOUT CONTRAST TECHNIQUE:  Multidetector CT imaging of the head and cervical spine was performed following the standard protocol without intravenous contrast. Multiplanar CT image reconstructions of the cervical spine were also generated. COMPARISON:  05/16/2012, 10/18/2006 FINDINGS: CT HEAD FINDINGS Brain: No evidence of acute infarction, hemorrhage, hydrocephalus, extra-axial collection or mass lesion/mass effect. Vascular: No hyperdense vessel or unexpected calcification. Skull: Normal. Negative for fracture or focal lesion. Sinuses/Orbits: No acute finding. Other: Negative for scalp hematoma. CT CERVICAL SPINE FINDINGS Alignment: Facet joints are aligned without dislocation or traumatic listhesis. Dens and lateral masses are aligned. Skull base and vertebrae: No acute fracture. No primary bone lesion or focal pathologic process. Soft tissues and spinal canal: No prevertebral fluid or swelling. No visible canal hematoma. Disc levels: Mild disc height loss and endplate spurring most pronounced at the C6-7 level. Relatively mild facet arthropathy within the cervical spine. Upper chest: Included lung apices are clear. Other: Area of soft tissue nodularity involving the cutaneous and subcutaneous soft tissues  of the posterior neck, left paramidline, at approximately the C4-5 level measuring 1.3 x 0.9 cm (series 5, image 45). IMPRESSION: 1. No acute intracranial findings. 2. No evidence of acute fracture or traumatic listhesis of the cervical spine. 3. Mild cervical spondylosis. 4. Nonspecific area of soft tissue nodularity involving the cutaneous and subcutaneous soft tissues of the posterior neck, left paramidline, at approximately the C4-5 level measuring 1.3 x 0.9 cm. This could potentially represent a complex sebaceous or epidermoid cyst. Solid lesion not excluded. Correlate with physical exam. Electronically Signed   By: Davina Poke D.O.   On: 09/23/2020 16:43   CT Cervical Spine Wo Contrast  Result Date: 09/23/2020 CLINICAL  DATA:  Fall, altered mental status, midline cervical tenderness EXAM: CT HEAD WITHOUT CONTRAST CT CERVICAL SPINE WITHOUT CONTRAST TECHNIQUE: Multidetector CT imaging of the head and cervical spine was performed following the standard protocol without intravenous contrast. Multiplanar CT image reconstructions of the cervical spine were also generated. COMPARISON:  05/16/2012, 10/18/2006 FINDINGS: CT HEAD FINDINGS Brain: No evidence of acute infarction, hemorrhage, hydrocephalus, extra-axial collection or mass lesion/mass effect. Vascular: No hyperdense vessel or unexpected calcification. Skull: Normal. Negative for fracture or focal lesion. Sinuses/Orbits: No acute finding. Other: Negative for scalp hematoma. CT CERVICAL SPINE FINDINGS Alignment: Facet joints are aligned without dislocation or traumatic listhesis. Dens and lateral masses are aligned. Skull base and vertebrae: No acute fracture. No primary bone lesion or focal pathologic process. Soft tissues and spinal canal: No prevertebral fluid or swelling. No visible canal hematoma. Disc levels: Mild disc height loss and endplate spurring most pronounced at the C6-7 level. Relatively mild facet arthropathy within the cervical spine. Upper chest: Included lung apices are clear. Other: Area of soft tissue nodularity involving the cutaneous and subcutaneous soft tissues of the posterior neck, left paramidline, at approximately the C4-5 level measuring 1.3 x 0.9 cm (series 5, image 45). IMPRESSION: 1. No acute intracranial findings. 2. No evidence of acute fracture or traumatic listhesis of the cervical spine. 3. Mild cervical spondylosis. 4. Nonspecific area of soft tissue nodularity involving the cutaneous and subcutaneous soft tissues of the posterior neck, left paramidline, at approximately the C4-5 level measuring 1.3 x 0.9 cm. This could potentially represent a complex sebaceous or epidermoid cyst. Solid lesion not excluded. Correlate with physical exam.  Electronically Signed   By: Davina Poke D.O.   On: 09/23/2020 16:43   DG Chest Portable 1 View  Result Date: 09/23/2020 CLINICAL DATA:  Fall now with nausea and vomiting EXAM: PORTABLE CHEST 1 VIEW COMPARISON:  None. FINDINGS: The heart size and mediastinal contours are within normal limits. Reduced inspiratory volumes with bibasilar atelectasis. No focal consolidation. No visible pleural effusion or pneumothorax. The visualized skeletal structures are unremarkable. IMPRESSION: Reduced inspiratory volumes with bibasilar atelectasis. No focal consolidation or pleural effusion. Electronically Signed   By: Dahlia Bailiff MD   On: 09/23/2020 19:18    EKG: I independently viewed the EKG done and my findings are as followed: Sinus rhythm rate of 99.  Nonspecific ST-T changes.  QTc 443  Assessment/Plan Present on Admission:  Fall  Active Problems:   Fall  Fall, mechanical, slipped on a wet floor. CT head and cervical spine nonacute. Obtain orthostatic vital signs PT OT assessment Fall precautions Analgesics as needed  Pyuria UA nitrite positive Started on Rocephin 1 g daily Follow urine culture for ID and sensitivities Currently afebrile with no leukocytosis DC IV antibiotics if urine culture is negative.  Type 2 diabetes  with hyperglycemia Obtain hemoglobin A1c Start insulin sliding scale Lantus 10 U qhs, home dose IV fluid hydration lactated Ringer at 50 cc/h  Elevated troponin, suspect demand ischemia Troponin 10, repeated 18. She denies any anginal symptoms No evidence of acute ischemia on twelve-lead EKG Monitor on remote telemetry  Chronic normocytic anemia Hemoglobin 11.3 MCV 86 No overt bleeding. Monitor for now  Essential hypertension BP not at goal, elevated Resume home oral antihypertensives States she takes Lisinopril and cardizem Monitor vital signs  History of sinus tachycardia Resume home Cardizem Monitor on remote telemetry  History of  migraines States she has not taken Topamax in more than 2 years. Analgesics PRN Reports allergy to tylenol and oxycodone, they give her hives and make her throat close up.  She states she has tolerated motrin, tramadol and morphine in the past.  History of neck abscess status post I&D in 2020 States she was in the hospital for a month due to bacteremia as a complication of this neck abscess in 2020, did not know the name of the bacteria. She currently denies having any pain, no erythema, edema or tenderness on palpation.  Exam is benign.   DVT prophylaxis: Subcu Lovenox daily  Code Status: Full code  Family Communication: Her son at bedside.  Disposition Plan: Admit to MedSurg unit with remote telemetry  Consults called: None  Admission status: Observation status   Status is: Observation    Dispo:  Patient From: Home  Planned Disposition: Home possibly on 09/24/2020.  Medically stable for discharge: No      Kayleen Memos MD [facility] Pager [phone]  If 7PM-7AM, please contact night-coverage [email or web address] Password [facility]  09/23/2020, 8:35 PM

## 2020-09-23 NOTE — ED Provider Notes (Signed)
[facility] EMERGENCY DEPARTMENT Provider Note   CSN: [id number] Arrival date & time: 09/23/20  1528     History Chief Complaint  Patient presents with   Fall   Memory Loss    New after the fall    Hyperglycemia    410 EMS results     Claire Carrillo is a 55 y.o. female. Level 5 caveat due to altered mental status.  Fall  Hyperglycemia Patient presents after fall.  Reportedly fell at home but patient does not remember the events.  States she has pain in her front and back of her head and in right hip area.  Does not really know what happened.  Some mild nausea.  Confused to events.  Also cannot tell me what her address is.  She is able to tell me her name.  Denies chest pain.  EMS CBG was over 400.    History reviewed. No pertinent past medical history.  Patient Active Problem List   Diagnosis Date Noted   Fall 09/23/2020   Hypertension 04/15/2020   Primary osteoarthritis of both knees 07/06/2016   Elevated glucose 06/09/2016   Right flank pain 06/09/2016   UTI symptoms 06/09/2016   Carpal tunnel syndrome, bilateral 09/06/2015   Right shoulder pain 06/08/2012   Right shoulder strain 06/08/2012   Obesity 12/01/2011   Benign essential hypertension 11/04/2010   Dyspepsia and other specified disorders of function of stomach 11/04/2010   Hiatal hernia 11/04/2010   History of gastric ulcer 11/04/2010   Mixed hyperlipidemia 11/04/2010   Stress headaches 11/04/2010   Type 2 diabetes mellitus ([place]) 10/14/2010    History reviewed. No pertinent surgical history.   OB History   No obstetric history on file.     History reviewed. No pertinent family history.     Home Medications Prior to Admission medications   Medication Sig Start Date End Date Taking? Authorizing Provider  Blood Glucose Monitoring Suppl (FIFTY50 GLUCOSE METER 2.0) w/Device KIT See admin instructions. Check blood sugar every morning 12/18/15  Yes [provider]   CARDIZEM CD 240 MG 24 hr capsule Take 240 mg by mouth daily. 04/09/20  Yes [provider]  insulin aspart protamine- aspart (NOVOLOG MIX 70/30) (70-30) 100 UNIT/ML injection Inject 10 Units into the skin with breakfast, with lunch, and with evening meal. 05/02/18  Yes [provider]  insulin glargine (LANTUS SOLOSTAR) 100 UNIT/ML Solostar Pen Inject 10 Units into the skin at bedtime.   Yes [provider]  Insulin Pen Needle (B-D ULTRAFINE III SHORT PEN) 31G X 8 MM MISC Inject insulin qac and qhs 07/15/16  Yes [provider]  Ipratropium-Albuterol (COMBIVENT RESPIMAT) 20-100 MCG/ACT AERS respimat Inhale 1 puff into the lungs in the morning and at bedtime. 07/12/17  Yes [provider]  lisinopril-hydrochlorothiazide (ZESTORETIC) 20-12.5 MG tablet Take 1 tablet by mouth daily. 04/09/20  Yes [provider]  Magnesium Oxide (MAG-OXIDE PO) Take 1 tablet by mouth daily.   Yes [provider]  Naphazoline-Pheniramine (ALLERGY EYE OP) Place 1 drop into both eyes daily.   Yes [provider]  ondansetron (ZOFRAN-ODT) 4 MG disintegrating tablet Take 4 mg by mouth every 8 (eight) hours as needed for vomiting or nausea. 08/19/17  Yes [provider]  OZEMPIC, 0.25 OR 0.5 MG/DOSE, 2 MG/1.5ML SOPN Inject 1 mg into the skin every Sunday. 07/02/20  Yes [provider]  potassium gluconate (HM POTASSIUM) 595 (99 K) MG TABS tablet Take 595 mg by  mouth daily.   Yes [provider]    Allergies    Oxycodone-acetaminophen, Peanut-containing drug products, Morphine, Sulfa antibiotics, and Vancomycin  Review of Systems   Review of Systems  Unable to perform ROS: Mental status change   Physical Exam Updated Vital Signs BP (!) 141/70 (BP Location: Right Arm)   Pulse 91   Temp 98.3 F (36.8 C) (Oral)   Resp 18   Ht 5' 6"  (1.676 m)   Wt 90.7 kg   SpO2 98%   BMI 32.28 kg/m   Physical Exam Vitals and nursing note  reviewed.  HENT:     Head: Normocephalic.     Comments: Mild occipital tenderness without bleeding. Eyes:     Pupils: Pupils are equal, round, and reactive to light.  Neck:     Comments: Cervical collar in place.  Mild midline tenderness. Cardiovascular:     Rate and Rhythm: Normal rate and regular rhythm.  Chest:     Chest wall: No tenderness.  Abdominal:     Tenderness: There is no abdominal tenderness.  Musculoskeletal:     Comments: Mild tenderness over iliac crest on right.  No specific hip tenderness.  Good range of motion hip.  No other extremity tenderness.  No thoracic or lumbar tenderness.  Skin:    General: Skin is warm.     Capillary Refill: Capillary refill takes less than 2 seconds.  Neurological:     Mental Status: She is alert.     Comments: Awake and somewhat confused.  She will move all extremities to command but is somewhat confused.  Unable to tell me what her address is.  Able to tell me that she had a fall but does not know the events around it.  He will tell me where she hurts.    ED Results / Procedures / Treatments   Labs (all labs ordered are listed, but only abnormal results are displayed) Labs Reviewed  CBC WITH DIFFERENTIAL/PLATELET - Abnormal; Notable for the following components:      Result Value   Hemoglobin 11.3 (*)    HCT 35.1 (*)    All other components within normal limits  COMPREHENSIVE METABOLIC PANEL - Abnormal; Notable for the following components:   Glucose, Bld 377 (*)    Calcium 8.8 (*)    AST 13 (*)    All other components within normal limits  URINALYSIS, ROUTINE W REFLEX MICROSCOPIC - Abnormal; Notable for the following components:   Glucose, UA >=500 (*)    Nitrite POSITIVE (*)    Bacteria, UA RARE (*)    All other components within normal limits  CBG MONITORING, ED - Abnormal; Notable for the following components:   Glucose-Capillary 301 (*)    All other components within normal limits  TROPONIN I (HIGH SENSITIVITY) -  Abnormal; Notable for the following components:   Troponin I (High Sensitivity) 18 (*)    All other components within normal limits  RESP PANEL BY RT-PCR (FLU A&B, COVID) ARPGX2  URINE CULTURE  RAPID URINE DRUG SCREEN, HOSP PERFORMED  HIV ANTIBODY (ROUTINE TESTING W REFLEX)  CBC  CREATININE, SERUM  HEMOGLOBIN A1C  TROPONIN I (HIGH SENSITIVITY)    EKG EKG Interpretation  Date/Time:  Monday September 23 2020 15:51:54 EDT Ventricular Rate:  99 PR Interval:  142 QRS Duration: 94 QT Interval:  345 QTC Calculation: 443 R Axis:   73 Text Interpretation: Sinus rhythm Confirmed by Davonna Belling [phone]) on 09/23/2020 4:02:27 PM  Radiology CT Head  Wo Contrast  Result Date: 09/23/2020 CLINICAL DATA:  Fall, altered mental status, midline cervical tenderness EXAM: CT HEAD WITHOUT CONTRAST CT CERVICAL SPINE WITHOUT CONTRAST TECHNIQUE: Multidetector CT imaging of the head and cervical spine was performed following the standard protocol without intravenous contrast. Multiplanar CT image reconstructions of the cervical spine were also generated. COMPARISON:  05/16/2012, 10/18/2006 FINDINGS: CT HEAD FINDINGS Brain: No evidence of acute infarction, hemorrhage, hydrocephalus, extra-axial collection or mass lesion/mass effect. Vascular: No hyperdense vessel or unexpected calcification. Skull: Normal. Negative for fracture or focal lesion. Sinuses/Orbits: No acute finding. Other: Negative for scalp hematoma. CT CERVICAL SPINE FINDINGS Alignment: Facet joints are aligned without dislocation or traumatic listhesis. Dens and lateral masses are aligned. Skull base and vertebrae: No acute fracture. No primary bone lesion or focal pathologic process. Soft tissues and spinal canal: No prevertebral fluid or swelling. No visible canal hematoma. Disc levels: Mild disc height loss and endplate spurring most pronounced at the C6-7 level. Relatively mild facet arthropathy within the cervical spine. Upper chest: Included lung  apices are clear. Other: Area of soft tissue nodularity involving the cutaneous and subcutaneous soft tissues of the posterior neck, left paramidline, at approximately the C4-5 level measuring 1.3 x 0.9 cm (series 5, image 45). IMPRESSION: 1. No acute intracranial findings. 2. No evidence of acute fracture or traumatic listhesis of the cervical spine. 3. Mild cervical spondylosis. 4. Nonspecific area of soft tissue nodularity involving the cutaneous and subcutaneous soft tissues of the posterior neck, left paramidline, at approximately the C4-5 level measuring 1.3 x 0.9 cm. This could potentially represent a complex sebaceous or epidermoid cyst. Solid lesion not excluded. Correlate with physical exam. Electronically Signed   By: Davina Poke D.O.   On: 09/23/2020 16:43   CT Cervical Spine Wo Contrast  Result Date: 09/23/2020 CLINICAL DATA:  Fall, altered mental status, midline cervical tenderness EXAM: CT HEAD WITHOUT CONTRAST CT CERVICAL SPINE WITHOUT CONTRAST TECHNIQUE: Multidetector CT imaging of the head and cervical spine was performed following the standard protocol without intravenous contrast. Multiplanar CT image reconstructions of the cervical spine were also generated. COMPARISON:  05/16/2012, 10/18/2006 FINDINGS: CT HEAD FINDINGS Brain: No evidence of acute infarction, hemorrhage, hydrocephalus, extra-axial collection or mass lesion/mass effect. Vascular: No hyperdense vessel or unexpected calcification. Skull: Normal. Negative for fracture or focal lesion. Sinuses/Orbits: No acute finding. Other: Negative for scalp hematoma. CT CERVICAL SPINE FINDINGS Alignment: Facet joints are aligned without dislocation or traumatic listhesis. Dens and lateral masses are aligned. Skull base and vertebrae: No acute fracture. No primary bone lesion or focal pathologic process. Soft tissues and spinal canal: No prevertebral fluid or swelling. No visible canal hematoma. Disc levels: Mild disc height loss and  endplate spurring most pronounced at the C6-7 level. Relatively mild facet arthropathy within the cervical spine. Upper chest: Included lung apices are clear. Other: Area of soft tissue nodularity involving the cutaneous and subcutaneous soft tissues of the posterior neck, left paramidline, at approximately the C4-5 level measuring 1.3 x 0.9 cm (series 5, image 45). IMPRESSION: 1. No acute intracranial findings. 2. No evidence of acute fracture or traumatic listhesis of the cervical spine. 3. Mild cervical spondylosis. 4. Nonspecific area of soft tissue nodularity involving the cutaneous and subcutaneous soft tissues of the posterior neck, left paramidline, at approximately the C4-5 level measuring 1.3 x 0.9 cm. This could potentially represent a complex sebaceous or epidermoid cyst. Solid lesion not excluded. Correlate with physical exam. Electronically Signed   By: Davina Poke D.O.  On: 09/23/2020 16:43   DG Chest Portable 1 View  Result Date: 09/23/2020 CLINICAL DATA:  Fall now with nausea and vomiting EXAM: PORTABLE CHEST 1 VIEW COMPARISON:  None. FINDINGS: The heart size and mediastinal contours are within normal limits. Reduced inspiratory volumes with bibasilar atelectasis. No focal consolidation. No visible pleural effusion or pneumothorax. The visualized skeletal structures are unremarkable. IMPRESSION: Reduced inspiratory volumes with bibasilar atelectasis. No focal consolidation or pleural effusion. Electronically Signed   By: Dahlia Bailiff MD   On: 09/23/2020 19:18    Procedures Procedures   Medications Ordered in ED Medications  cefTRIAXone (ROCEPHIN) 1 g in sodium chloride 0.9 % 100 mL IVPB (0 g Intravenous Stopped 09/23/20 2201)  enoxaparin (LOVENOX) injection 40 mg (40 mg Subcutaneous Given 09/23/20 2159)  lactated ringers infusion ( Intravenous New Bag/Given 09/23/20 2159)  insulin aspart (novoLOG) injection 0-9 Units (has no administration in time range)  insulin aspart (novoLOG)  injection 0-5 Units (4 Units Subcutaneous Given 09/23/20 2203)  melatonin tablet 3 mg (3 mg Oral Given 09/23/20 2325)  polyethylene glycol (MIRALAX / GLYCOLAX) packet 17 g (has no administration in time range)  ondansetron (ZOFRAN) injection 4 mg (has no administration in time range)  metoprolol tartrate (LOPRESSOR) injection 2.5 mg (has no administration in time range)  lisinopril (ZESTRIL) tablet 20 mg (20 mg Oral Given 09/23/20 2211)  traMADol (ULTRAM) tablet 50 mg (50 mg Oral Given 09/23/20 2211)  morphine 2 MG/ML injection 2 mg (has no administration in time range)  insulin glargine (LANTUS) injection 10 Units (has no administration in time range)  diltiazem (CARDIZEM CD) 24 hr capsule 120 mg (120 mg Oral Given 09/23/20 2317)  ibuprofen (ADVIL) tablet 100 mg (100 mg Oral Given 09/23/20 2325)  ketorolac (TORADOL) 30 MG/ML injection 15 mg (15 mg Intravenous Given 09/23/20 1744)  sodium chloride 0.9 % bolus 500 mL (0 mLs Intravenous Stopped 09/23/20 2105)    ED Course  I have reviewed the triage vital signs and the nursing notes.  Pertinent labs & imaging results that were available during my care of the patient were reviewed by me and considered in my medical decision making (see chart for details).    MDM Rules/Calculators/A&P                          Patient presents after fall.  Confusion.  Patient did not tell what happened.  Amnestic to the event and initially amnestic to other events and data such as where she lived.  That eventually improved but took about an hour of time in the ER.  Still moving all extremities.  Head CT reassuring.  Cervical spine CT showed a soft tissue abnormality that appears to be a scar area from previous resection.  Troponin delta just minimally above normal.  Not having chest pain.  With syncope of unknown origin.  I feels patient benefit from admission to the hospital.  Patient did not bite tongue or have urinary incontinence.  Seizure felt less likely but not  completely ruled out.  Will discuss with unassigned medicine.  Sugar is mildly elevated patient not in DKA Final Clinical Impression(s) / ED Diagnoses Final diagnoses:  Syncope, unspecified syncope type  Hyperglycemia    Rx / DC Orders ED Discharge Orders     None        Davonna Belling, MD 09/23/20 2338

## 2020-09-23 NOTE — ED Notes (Signed)
Vital signs stable. 

## 2020-09-23 NOTE — ED Notes (Signed)
Received verbal report from [place] at this time

## 2020-09-24 ENCOUNTER — Observation Stay (HOSPITAL_COMMUNITY): Payer: Self-pay

## 2020-09-24 DIAGNOSIS — IMO0002 Reserved for concepts with insufficient information to code with codable children: Secondary | ICD-10-CM

## 2020-09-24 DIAGNOSIS — E1169 Type 2 diabetes mellitus with other specified complication: Secondary | ICD-10-CM

## 2020-09-24 DIAGNOSIS — S060X9A Concussion with loss of consciousness of unspecified duration, initial encounter: Secondary | ICD-10-CM

## 2020-09-24 DIAGNOSIS — E1165 Type 2 diabetes mellitus with hyperglycemia: Secondary | ICD-10-CM

## 2020-09-24 DIAGNOSIS — I10 Essential (primary) hypertension: Secondary | ICD-10-CM

## 2020-09-24 DIAGNOSIS — R413 Other amnesia: Secondary | ICD-10-CM

## 2020-09-24 DIAGNOSIS — Z794 Long term (current) use of insulin: Secondary | ICD-10-CM

## 2020-09-24 LAB — CBC
HCT: 33.1 % — ABNORMAL LOW (ref 36.0–46.0)
Hemoglobin: 10.8 g/dL — ABNORMAL LOW (ref 12.0–15.0)
MCH: 27.8 pg (ref 26.0–34.0)
MCHC: 32.6 g/dL (ref 30.0–36.0)
MCV: 85.3 fL (ref 80.0–100.0)
Platelets: 306 10*3/uL (ref 150–400)
RBC: 3.88 MIL/uL (ref 3.87–5.11)
RDW: 13.2 % (ref 11.5–15.5)
WBC: 10.1 10*3/uL (ref 4.0–10.5)
nRBC: 0 % (ref 0.0–0.2)

## 2020-09-24 LAB — HEMOGLOBIN A1C
Hgb A1c MFr Bld: 10.6 % — ABNORMAL HIGH (ref 4.8–5.6)
Mean Plasma Glucose: 257.52 mg/dL

## 2020-09-24 LAB — CREATININE, SERUM
Creatinine, Ser: 0.76 mg/dL (ref 0.44–1.00)
GFR, Estimated: >60 mL/min (ref >60)

## 2020-09-24 LAB — HIV ANTIBODY (ROUTINE TESTING W REFLEX): HIV Screen 4th Generation wRfx: NONREACTIVE

## 2020-09-24 LAB — GLUCOSE, CAPILLARY
Glucose-Capillary: 219 mg/dL — ABNORMAL HIGH (ref 70–99)
Glucose-Capillary: 238 mg/dL — ABNORMAL HIGH (ref 70–99)

## 2020-09-24 MED ORDER — ACETAMINOPHEN 500 MG PO TABS: 1000.0000 mg | ORAL_TABLET | Freq: Four times a day (QID) | ORAL | 0 refills | Status: DC | PRN

## 2020-09-24 MED ORDER — ACETAMINOPHEN 500 MG PO TABS: 1000.0000 mg | ORAL_TABLET | Freq: Four times a day (QID) | ORAL | Status: DC | PRN

## 2020-09-24 MED ORDER — ACETAMINOPHEN 500 MG PO TABS: 1000.0000 mg | ORAL_TABLET | Freq: Four times a day (QID) | ORAL | 0 refills | Status: AC | PRN

## 2020-09-24 NOTE — Progress Notes (Signed)
OT Cancellation Note  Patient Details Name: Adaleah Forget MRN: [id number] DOB: [birth year]   Cancelled Treatment:    Reason Eval/Treat Not Completed: Patient at procedure or test/ unavailable (MRI. Will attempt later as schedule allows)  [place], OT/L   Acute OT Clinical Specialist Acute Rehabilitation Services Pager [phone] Office [phone]  09/24/2020, 9:14 AM

## 2020-09-24 NOTE — Progress Notes (Signed)
Patient has adequate appetite, daughter in room at bedside. Pain managed with prn tramadol.

## 2020-09-24 NOTE — Discharge Summary (Addendum)
Physician Discharge Summary  Claire Carrillo MRN:[id number] DOB: 03/24/1965 DOA: 09/23/2020  PCP: Miles, Linda M, MD  Admit date: 09/23/2020 Discharge date: 09/24/2020  Admitted From: home  Disposition:  home   Recommendations for Outpatient Follow-up:  Referred to TBI clinic made Recommended to not drive and not return to work Recommended to f/u with her PCP next week   Home Health:  none  Discharge Condition:  stable   CODE STATUS:  Full code   Consultations: none  Procedures/Studies: none   Discharge Diagnoses:  Principal Problem:   Mechanical fall with Concussion w loss of consciousness of unsp duration, init Active Problems:   Temporary memory loss   Benign essential hypertension   DM (diabetes mellitus), type 2, uncontrolled (HCC)     Brief Summary:  Claire Carrillo is a 55 y.o. female with medical history significant for hypertension, GERD, type 2 diabetes, migraines, neck abscess status post incision and drainage by general surgery in 2020, who presented to [facility] ED from home after a fall.  She reports she slipped on water, fell and landed on the back of her head.  She is alert and oriented x 4 at the time of this visit.  The fall was unwitnessed.  She was in her usual state of health prior to this.  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  She lives with a roommate.  In the ED she reports having a headache.  CT head and cervical spine nonacute.  EDP requested admission for observation.  UA positive for nitrite.  CXR non acute.    Hospital Course:  Fall with concussion and post concussion syndrome - CT of the head and neck unrevealing - Orthostatic vitals negative -Had confusion and some memory loss after the event however was oriented x3 when the admitting doctor was seeing her - dicussed events of yesterday with neurology- We suspect the patient most likely had a concussion after her fall. - This morning she  complains of a headache in the back of her head which is the part that she hit on the floor.  She has not milder headache in the frontal area.  Her occipital area is tender to the touch, there is no bruising bleeding or breakage of skin.  She also has some tenderness in her cervical and trapezius area which she also states has been present since the fall. - This morning she sided to take a shower by herself and states that she did well - OT recommending TBI clinic - I don't think she needs further neuro work up or a formal neuro consult  Diabetes mellitus uncontrolled with hyperglycemia and glycosuria -last Hemoglobin A1C- now 10.6 (today) - Home medications list 70/30 and Ozempic - she is not on Lantus which was dicontinued in favor of 70/30    Essential hypertension - Continue Cardizem and Zestoretic  Of note: - troponin were 18-I do not feel this is of any significance - UA showed positive nitrites and 4 bacteria-I do not feel this was a UTI and have discontinued the ceftriaxone  Discharge Exam: Vitals:   09/24/20 0832 09/24/20 1230  BP: 131/77 118/71  Pulse: 78 75  Resp: 20 20  Temp: 98.4 F (36.9 C) 98.7 F (37.1 C)  SpO2: 98% 100%   Vitals:   09/24/20 0039 09/24/20 0423 09/24/20 0832 09/24/20 1230  BP: 127/78 99/62 131/77 118/71  Pulse: 100 80 78 75  Resp: 18 16 20 20    Temp: 98.2 F (36.8 C) 98.4 F (36.9 C) 98.4 F (36.9 C) 98.7 F (37.1 C)  TempSrc: Oral Oral Oral Oral  SpO2: 95% 100% 98% 100%  Weight:      Height:        General: Pt is alert, awake, not in acute distress Cardiovascular: RRR, S1/S2 +, no rubs, no gallops Respiratory: CTA bilaterally, no wheezing, no rhonchi Abdominal: Soft, NT, ND, bowel sounds + Extremities: no edema, no cyanosis   Discharge Instructions  Discharge Instructions     Call MD for:  persistant dizziness or light-headedness   Complete by: As directed    Call MD for:  persistant nausea and vomiting   Complete by: As  directed    Diet - low sodium heart healthy   Complete by: As directed    Diet Carb Modified   Complete by: As directed    Increase activity slowly   Complete by: As directed       Allergies as of 09/24/2020       Reactions   Oxycodone-acetaminophen Hives   States they give her hives and make her throat close up.   Peanut-containing Drug Products Anaphylaxis   Morphine    Sulfa Antibiotics Other (See Comments)   Other reaction(s): Blood Disorder   Vancomycin Itching        Medication List     TAKE these medications    acetaminophen 500 MG tablet Commonly known as: TYLENOL Take 2 tablets (1,000 mg total) by mouth every 6 (six) hours as needed for headache, fever or mild pain.   ALLERGY EYE OP Place 1 drop into both eyes daily.   B-D ULTRAFINE III SHORT PEN 31G X 8 MM Misc Generic drug: Insulin Pen Needle Inject insulin qac and qhs   Cardizem CD 240 MG 24 hr capsule Generic drug: diltiazem Take 240 mg by mouth daily.   Combivent Respimat 20-100 MCG/ACT Aers respimat Generic drug: Ipratropium-Albuterol Inhale 1 puff into the lungs in the morning and at bedtime.   Fifty50 Glucose Meter 2.0 w/Device Kit See admin instructions. Check blood sugar every morning   HM Potassium 595 (99 K) MG Tabs tablet Generic drug: potassium gluconate Take 595 mg by mouth daily.   insulin aspart protamine- aspart (70-30) 100 UNIT/ML injection Commonly known as: NOVOLOG MIX 70/30 Inject 10 Units into the skin with breakfast, with lunch, and with evening meal.   lisinopril-hydrochlorothiazide 20-12.5 MG tablet Commonly known as: ZESTORETIC Take 1 tablet by mouth daily.   MAG-OXIDE PO Take 1 tablet by mouth daily.   ondansetron 4 MG disintegrating tablet Commonly known as: ZOFRAN-ODT Take 4 mg by mouth every 8 (eight) hours as needed for vomiting or nausea.   Ozempic (0.25 or 0.5 MG/DOSE) 2 MG/1.5ML Sopn Generic drug: Semaglutide(0.25 or 0.5MG/DOS) Inject 1 mg into the  skin every Sunday.        Follow-up Information     Miles, Linda M, MD. Schedule an appointment as soon as possible for a visit in 1 week(s).   Specialty: Family Medicine Contact information: [address] [place]  [address] [phone]                Allergies  Allergen Reactions   Oxycodone-Acetaminophen Hives    States they give her hives and make her throat close up.   Peanut-Containing Drug Products Anaphylaxis   Morphine    Sulfa Antibiotics Other (See Comments)    Other reaction(s): Blood Disorder    Vancomycin Itching        CT Head Wo Contrast  Result Date: 09/23/2020 CLINICAL DATA:  Fall, altered mental status, midline cervical tenderness EXAM: CT HEAD WITHOUT CONTRAST CT CERVICAL SPINE WITHOUT CONTRAST TECHNIQUE: Multidetector CT imaging of the head and cervical spine was performed following the standard protocol without intravenous contrast. Multiplanar CT image reconstructions of the cervical spine were also generated. COMPARISON:  05/16/2012, 10/18/2006 FINDINGS: CT HEAD FINDINGS Brain: No evidence of acute infarction, hemorrhage, hydrocephalus, extra-axial collection or mass lesion/mass effect. Vascular: No hyperdense vessel or unexpected calcification. Skull: Normal. Negative for fracture or focal lesion. Sinuses/Orbits: No acute finding. Other: Negative for scalp hematoma. CT CERVICAL SPINE FINDINGS Alignment: Facet joints are aligned without dislocation or traumatic listhesis. Dens and lateral masses are aligned. Skull base and vertebrae: No acute fracture. No primary bone lesion or focal pathologic process. Soft tissues and spinal canal: No prevertebral fluid or swelling. No visible canal hematoma. Disc levels: Mild disc height loss and endplate spurring most pronounced at the C6-7 level. Relatively mild facet arthropathy within the cervical spine. Upper chest: Included lung apices are clear. Other: Area of soft tissue nodularity involving the cutaneous  and subcutaneous soft tissues of the posterior neck, left paramidline, at approximately the C4-5 level measuring 1.3 x 0.9 cm (series 5, image 45). IMPRESSION: 1. No acute intracranial findings. 2. No evidence of acute fracture or traumatic listhesis of the cervical spine. 3. Mild cervical spondylosis. 4. Nonspecific area of soft tissue nodularity involving the cutaneous and subcutaneous soft tissues of the posterior neck, left paramidline, at approximately the C4-5 level measuring 1.3 x 0.9 cm. This could potentially represent a complex sebaceous or epidermoid cyst. Solid lesion not excluded. Correlate with physical exam. Electronically Signed   By: Nicholas  Plundo D.O.   On: 09/23/2020 16:43   CT Cervical Spine Wo Contrast  Result Date: 09/23/2020 CLINICAL DATA:  Fall, altered mental status, midline cervical tenderness EXAM: CT HEAD WITHOUT CONTRAST CT CERVICAL SPINE WITHOUT CONTRAST TECHNIQUE: Multidetector CT imaging of the head and cervical spine was performed following the standard protocol without intravenous contrast. Multiplanar CT image reconstructions of the cervical spine were also generated. COMPARISON:  05/16/2012, 10/18/2006 FINDINGS: CT HEAD FINDINGS Brain: No evidence of acute infarction, hemorrhage, hydrocephalus, extra-axial collection or mass lesion/mass effect. Vascular: No hyperdense vessel or unexpected calcification. Skull: Normal. Negative for fracture or focal lesion. Sinuses/Orbits: No acute finding. Other: Negative for scalp hematoma. CT CERVICAL SPINE FINDINGS Alignment: Facet joints are aligned without dislocation or traumatic listhesis. Dens and lateral masses are aligned. Skull base and vertebrae: No acute fracture. No primary bone lesion or focal pathologic process. Soft tissues and spinal canal: No prevertebral fluid or swelling. No visible canal hematoma. Disc levels: Mild disc height loss and endplate spurring most pronounced at the C6-7 level. Relatively mild facet  arthropathy within the cervical spine. Upper chest: Included lung apices are clear. Other: Area of soft tissue nodularity involving the cutaneous and subcutaneous soft tissues of the posterior neck, left paramidline, at approximately the C4-5 level measuring 1.3 x 0.9 cm (series 5, image 45). IMPRESSION: 1. No acute intracranial findings. 2. No evidence of acute fracture or traumatic listhesis of the cervical spine. 3. Mild cervical spondylosis. 4. Nonspecific area of soft tissue nodularity involving the cutaneous and subcutaneous soft tissues of the posterior neck, left paramidline, at approximately the C4-5 level measuring 1.3 x 0.9 cm. This could potentially represent a complex sebaceous or epidermoid cyst. Solid lesion not excluded. Correlate with physical exam. Electronically Signed   By: Nicholas    Plundo D.O.   On: 09/23/2020 16:43   DG Chest Portable 1 View  Result Date: 09/23/2020 CLINICAL DATA:  Fall now with nausea and vomiting EXAM: PORTABLE CHEST 1 VIEW COMPARISON:  None. FINDINGS: The heart size and mediastinal contours are within normal limits. Reduced inspiratory volumes with bibasilar atelectasis. No focal consolidation. No visible pleural effusion or pneumothorax. The visualized skeletal structures are unremarkable. IMPRESSION: Reduced inspiratory volumes with bibasilar atelectasis. No focal consolidation or pleural effusion. Electronically Signed   By: Dahlia Bailiff MD   On: 09/23/2020 19:18     The results of significant diagnostics from this hospitalization (including imaging, microbiology, ancillary and laboratory) are listed below for reference.     Microbiology: Recent Results (from the past 240 hour(s))  Resp Panel by RT-PCR (Flu A&B, Covid) Nasopharyngeal Swab     Status: None   Collection Time: 09/23/20  3:53 PM   Specimen: Nasopharyngeal Swab; Nasopharyngeal(NP) swabs in vial transport medium  Result Value Ref Range Status   SARS Coronavirus 2 by RT PCR NEGATIVE NEGATIVE  Final    Comment: (NOTE) SARS-CoV-2 target nucleic acids are NOT DETECTED.  The SARS-CoV-2 RNA is generally detectable in upper respiratory specimens during the acute phase of infection. The lowest concentration of SARS-CoV-2 viral copies this assay can detect is 138 copies/mL. A negative result does not preclude SARS-Cov-2 infection and should not be used as the sole basis for treatment or other patient management decisions. A negative result may occur with  improper specimen collection/handling, submission of specimen other than nasopharyngeal swab, presence of viral mutation(s) within the areas targeted by this assay, and inadequate number of viral copies(<138 copies/mL). A negative result must be combined with clinical observations, patient history, and epidemiological information. The expected result is Negative.  Fact Sheet for Patients:  EntrepreneurPulse.com.au  Fact Sheet for Healthcare Providers:  IncredibleEmployment.be  This test is no t yet approved or cleared by the [place] FDA and  has been authorized for detection and/or diagnosis of SARS-CoV-2 by FDA under an Emergency Use Authorization (EUA). This EUA will remain  in effect (meaning this test can be used) for the duration of the COVID-19 declaration under Section 564(b)(1) of the Act, 21 U.S.C.section 360bbb-3(b)(1), unless the authorization is terminated  or revoked sooner.       Influenza A by PCR NEGATIVE NEGATIVE Final   Influenza B by PCR NEGATIVE NEGATIVE Final    Comment: (NOTE) The Xpert Xpress SARS-CoV-2/FLU/RSV plus assay is intended as an aid in the diagnosis of influenza from Nasopharyngeal swab specimens and should not be used as a sole basis for treatment. Nasal washings and aspirates are unacceptable for Xpert Xpress SARS-CoV-2/FLU/RSV testing.  Fact Sheet for Patients: EntrepreneurPulse.com.au  Fact Sheet for Healthcare  Providers: IncredibleEmployment.be  This test is not yet approved or cleared by the [place] FDA and has been authorized for detection and/or diagnosis of SARS-CoV-2 by FDA under an Emergency Use Authorization (EUA). This EUA will remain in effect (meaning this test can be used) for the duration of the COVID-19 declaration under Section 564(b)(1) of the Act, 21 U.S.C. section 360bbb-3(b)(1), unless the authorization is terminated or revoked.  Performed at [facility], [place] [address] [place], [place] [address]      Labs: BNP (last 3 results) No results for input(s): BNP in the last 8760 hours. Basic Metabolic Panel: Recent Labs  Lab 09/23/20 1548 09/24/20 0117  NA 136  --   K 3.9  --   CL  101  --   CO2 28  --   GLUCOSE 377*  --   BUN 9  --   CREATININE 0.64 0.76  CALCIUM 8.8*  --    Liver Function Tests: Recent Labs  Lab 09/23/20 1548  AST 13*  ALT 13  ALKPHOS 77  BILITOT 0.7  PROT 7.1  ALBUMIN 3.5   No results for input(s): LIPASE, AMYLASE in the last 168 hours. No results for input(s): AMMONIA in the last 168 hours. CBC: Recent Labs  Lab 09/23/20 1548 09/24/20 0117  WBC 8.7 10.1  NEUTROABS 5.6  --   HGB 11.3* 10.8*  HCT 35.1* 33.1*  MCV 86.5 85.3  PLT 327 306   Cardiac Enzymes: No results for input(s): CKTOTAL, CKMB, CKMBINDEX, TROPONINI in the last 168 hours. BNP: Invalid input(s): POCBNP CBG: Recent Labs  Lab 09/23/20 2201 09/24/20 0831 09/24/20 1230  GLUCAP 301* 219* 238*   D-Dimer No results for input(s): DDIMER in the last 72 hours. Hgb A1c Recent Labs    09/24/20 0117  HGBA1C 10.6*   Lipid Profile No results for input(s): CHOL, HDL, LDLCALC, TRIG, CHOLHDL, LDLDIRECT in the last 72 hours. Thyroid function studies No results for input(s): TSH, T4TOTAL, T3FREE, THYROIDAB in the last 72 hours.  Invalid input(s): FREET3 Anemia work up No results for input(s): VITAMINB12, FOLATE, FERRITIN, TIBC, IRON,  RETICCTPCT in the last 72 hours. Urinalysis    Component Value Date/Time   COLORURINE YELLOW 09/23/2020 1735   APPEARANCEUR CLEAR 09/23/2020 1735   APPEARANCEUR Clear 05/27/2013 0353   LABSPEC 1.022 09/23/2020 1735   LABSPEC 1.010 05/27/2013 0353   PHURINE 8.0 09/23/2020 1735   GLUCOSEU >=500 (A) 09/23/2020 1735   GLUCOSEU 50 mg/dL 05/27/2013 0353   HGBUR NEGATIVE 09/23/2020 1735   BILIRUBINUR NEGATIVE 09/23/2020 1735   BILIRUBINUR Negative 05/27/2013 0353   KETONESUR NEGATIVE 09/23/2020 1735   PROTEINUR NEGATIVE 09/23/2020 1735   NITRITE POSITIVE (A) 09/23/2020 1735   LEUKOCYTESUR NEGATIVE 09/23/2020 1735   LEUKOCYTESUR Negative 05/27/2013 0353   Sepsis Labs Invalid input(s): PROCALCITONIN,  WBC,  LACTICIDVEN Microbiology Recent Results (from the past 240 hour(s))  Resp Panel by RT-PCR (Flu A&B, Covid) Nasopharyngeal Swab     Status: None   Collection Time: 09/23/20  3:53 PM   Specimen: Nasopharyngeal Swab; Nasopharyngeal(NP) swabs in vial transport medium  Result Value Ref Range Status   SARS Coronavirus 2 by RT PCR NEGATIVE NEGATIVE Final    Comment: (NOTE) SARS-CoV-2 target nucleic acids are NOT DETECTED.  The SARS-CoV-2 RNA is generally detectable in upper respiratory specimens during the acute phase of infection. The lowest concentration of SARS-CoV-2 viral copies this assay can detect is 138 copies/mL. A negative result does not preclude SARS-Cov-2 infection and should not be used as the sole basis for treatment or other patient management decisions. A negative result may occur with  improper specimen collection/handling, submission of specimen other than nasopharyngeal swab, presence of viral mutation(s) within the areas targeted by this assay, and inadequate number of viral copies(<138 copies/mL). A negative result must be combined with clinical observations, patient history, and epidemiological information. The expected result is Negative.  Fact Sheet for  Patients:  https://www.fda.gov/media/152166/download  Fact Sheet for Healthcare Providers:  https://www.fda.gov/media/152162/download  This test is no t yet approved or cleared by the [place] FDA and  has been authorized for detection and/or diagnosis of SARS-CoV-2 by FDA under an Emergency Use Authorization (EUA). This EUA will remain  in effect (meaning this test can be used)   for the duration of the COVID-19 declaration under Section 564(b)(1) of the Act, 21 U.S.C.section 360bbb-3(b)(1), unless the authorization is terminated  or revoked sooner.       Influenza A by PCR NEGATIVE NEGATIVE Final   Influenza B by PCR NEGATIVE NEGATIVE Final    Comment: (NOTE) The Xpert Xpress SARS-CoV-2/FLU/RSV plus assay is intended as an aid in the diagnosis of influenza from Nasopharyngeal swab specimens and should not be used as a sole basis for treatment. Nasal washings and aspirates are unacceptable for Xpert Xpress SARS-CoV-2/FLU/RSV testing.  Fact Sheet for Patients: https://www.fda.gov/media/152166/download  Fact Sheet for Healthcare Providers: https://www.fda.gov/media/152162/download  This test is not yet approved or cleared by the [place] FDA and has been authorized for detection and/or diagnosis of SARS-CoV-2 by FDA under an Emergency Use Authorization (EUA). This EUA will remain in effect (meaning this test can be used) for the duration of the COVID-19 declaration under Section 564(b)(1) of the Act, 21 U.S.C. section 360bbb-3(b)(1), unless the authorization is terminated or revoked.  Performed at [facility], [address] [place], [place] [address]      Time coordinating discharge in minutes: 64  SIGNED:   Saima Rizwan, MD  [facility] 09/24/2020, 4:33 PM   

## 2020-09-24 NOTE — Progress Notes (Signed)
PT Cancellation Note  Patient Details Name: Claire Carrillo MRN: [id number] DOB: 1965/12/03   Cancelled Treatment:    Reason Eval/Treat Not Completed: Patient at procedure or test/unavailable.  At MRI and will retry as time and pt allow.   Ramond Dial 09/24/2020, 9:26 AM  Mee Hives, PT MS Acute Rehab Dept. Number: [place]

## 2020-09-24 NOTE — Progress Notes (Signed)
Occupational Therapy Evaluation Patient Details Name: Claire Carrillo MRN: [id number] DOB: 01-11-1966 Today's Date: 09/24/2020    History of Present Illness 55 y.o. female admitted with a Mechanical fall with Concussion w loss of consciousness. Medical history significant for hypertension, GERD, type 2 diabetes, migraines, neck abscess status post incision and drainage by general surgery in 2020.   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 Pt complaining of headache, dizziness. light sensitivity and difficulty reading, in addition to STM deficits. Pt states "it took me forever to order something to eat because I was having so much trouble reading the menu". Pt educated on post concussion symptoms and given written information. Pt states she needs to return to work due to financial issues. Encouraged pt to take time off from work given her current symptoms and concerns over her driving in addition to completing her job duties at the prison. Recommend pt discuss options of returning to work with her supervisor given her current situation - pt verbalized understanding. Recommend follow up at the concussion clinic - CM/MD notified. Pt appreciative.     Follow Up Recommendations  Supervision - Intermittent    Equipment Recommendations  None recommended by OT    Recommendations for Other Services Other (comment) (concussion clinic)     Precautions / Restrictions Precautions Precautions: Fall      Mobility Bed Mobility Overal bed mobility: Modified Independent                  Transfers Overall transfer level: Independent                    Balance Overall balance assessment: Mild deficits observed, not formally tested (complains of feeling "dizzy")                                         ADL either performed or assessed with clinical judgement   ADL                                          General ADL Comments: able to complete basic ADL tasks. REcommend S with medicaiton adn financial management initially     Vision Baseline Vision/History: Wears glasses Patient Visual Report: Blurring of vision (more difficulty reading; decreased visual attention) Vision Assessment?: Vision impaired- to be further tested in functional context     Perception     Praxis      Pertinent Vitals/Pain Pain Assessment: 0-10 Pain Score: 8  Pain Location: neck Pain Descriptors / Indicators: Aching;Discomfort;Grimacing Pain Intervention(s): Limited activity within patient's tolerance;Ice applied     Hand Dominance     Extremity/Trunk Assessment Upper Extremity Assessment Upper Extremity Assessment: Overall WFL for tasks assessed           Communication Communication Communication: No difficulties   Cognition Arousal/Alertness: Awake/alert Behavior During Therapy: WFL for tasks assessed/performed Overall Cognitive Status: Impaired/Different from baseline Area of Impairment: Attention;Memory                   Current Attention Level: Selective Memory: Decreased short-term memory         General Comments: appears to be post concussive   General Comments       Exercises  Shoulder Instructions      Home Living Family/patient expects to be discharged to:: Private residence Living Arrangements: Non-relatives/Friends Available Help at Discharge: Family;Available PRN/intermittently Type of Home: House       Home Layout: Two level Alternate Level Stairs-Number of Steps: 16 Alternate Level Stairs-Rails: Right Bathroom Shower/Tub: Teacher, early years/pre: Standard Bathroom Accessibility: Yes How Accessible: Accessible via walker Home Equipment: None          Prior Functioning/Environment Level of Independence: Independent        Comments: works as Corporate treasurer at prison        OT Problem List: Impaired  vision/perception;Decreased cognition;Pain      OT Treatment/Interventions:      OT Goals(Current goals can be found in the care plan section) Acute Rehab OT Goals Patient Stated Goal: to get back to work OT Goal Formulation: All assessment and education complete, DC therapy  OT Frequency:     Barriers to D/C:            Co-evaluation              AM-PAC OT "6 Clicks" Daily Activity     Outcome Measure Help from another person eating meals?: None Help from another person taking care of personal grooming?: None Help from another person toileting, which includes using toliet, bedpan, or urinal?: None Help from another person bathing (including washing, rinsing, drying)?: None Help from another person to put on and taking off regular upper body clothing?: None Help from another person to put on and taking off regular lower body clothing?: None 6 Click Score: 24   End of Session Nurse Communication: Mobility status;Other (comment) (DC needs)  Activity Tolerance: Patient tolerated treatment well Patient left: in bed;with call bell/phone within reach  OT Visit Diagnosis: Other symptoms and signs involving cognitive function;Dizziness and giddiness (R42);Pain Pain - part of body:  (neck)                Time: 4536-4680 OT Time Calculation (min): 16 min Charges:  OT General Charges $OT Visit: 1 Visit OT Evaluation $OT Eval Low Complexity: [place], OT/L   Acute OT Clinical Specialist [place] Pager [phone] Office [phone]   [facility] 09/24/2020, 3:57 PM

## 2020-09-24 NOTE — Progress Notes (Signed)
Inpatient Diabetes Program Recommendations  AACE/ADA: New Consensus Statement on Inpatient Glycemic Control (2015)  Target Ranges:  Prepandial:   less than 140 mg/dL      Peak postprandial:   less than 180 mg/dL (1-2 hours)      Critically ill patients:  140 - 180 mg/dL   Lab Results  Component Value Date   GLUCAP 219 (H) 09/24/2020   HGBA1C 10.6 (H) 09/24/2020    Review of Glycemic Control Results for Claire Carrillo, Claire Carrillo (MRN [id number]) as of 09/24/2020 09:12  Ref. Range 09/23/2020 22:01 09/24/2020 08:31  Glucose-Capillary Latest Ref Range: 70 - 99 mg/dL 301 (H) 219 (H)   Diabetes history: DM2 Outpatient Diabetes medications: Lantus 10 (will verify when on 70/30 insulin mix) Ozempic 1 mg q Sunday Current orders for Inpatient glycemic control: Lantus 10 units Novolog correction 0-9 units tid + 0-5 hs  Inpatient Diabetes Program Recommendations:   -Increase lantus to 15 units qd -Add Novolog 5 units tid meal coverage if eats 50% Secure chat sent to Dr. Wynelle Cleveland.  Thank you, Nani Gasser. Janaia Kozel, RN, MSN, CDE  Diabetes Coordinator Inpatient Glycemic Control Team Team Pager [phone] (8am-5pm) 09/24/2020 9:18 AM

## 2020-09-24 NOTE — Discharge Instructions (Addendum)
PLEASE DO NOT DRIVE. 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 Postconcussion syndrome is a condition that happens after a mild brain injury. A "concussion" is another word for a mild brain injury. A concussion usually happens after hitting your head. But in some cases, it can happen after aninjury or accident that causes violent shaking of the head. Common causes of mild brain injuries include: ?Car accidents  ?Falling down and other accidents that can happen from daily activities  ?Injuries from playing sports such as football, ice hockey, soccer, and boxing  ?Injuries that can happen to soldiers during combat. These include injuries fromblasts and bullet wounds.  What are the symptoms of postconcussion syndrome? The symptoms include: ?Headache ?Dizziness or problems with balance ?Feeling very tired ?Feeling irritable, anxious, or depressed ?Memory problems or problems paying attention ?Problems with sleep ?Being easily bothered by noise or light  Some of these symptoms can make it hard to do your normal activities. As a result, they can be stressful or scary. It might help to keep in mind that theyare real problems caused by your concussion, and they will get better with time. Will I need tests? Maybe. Your doctor or nurse should be able to tell if you have postconcussionsyndrome by learning about your symptoms and doing an exam. But depending on your symptoms, you might have tests to make sure you do not have a different problem. For example, some people have an imaging test calledan MRI that creates pictures of the brain. How is postconcussion syndrome treated? The treatments are different depending on which symptoms you have. There aremedicines that can help with headaches, dizziness, and other problems. After a concussion, your doctor will tell you when you can  play sports or do your usual activities again. It will depend on your injury and symptoms, as well as the type of sport you play. Depending on your symptoms, you might notbe able to go back to work full time right away. If you have postconcussion syndrome, your symptoms will probably start to go away after about a week. Most people start to feel better in a week or 2, and are back to normal in 3 months. But a few people have symptoms that lastlonger. In these cases, your doctor might suggest medicines or other treatments.  You were cared for by a hospitalist during your hospital stay. If you have any questions about your discharge medications or the care you received while you were in the hospital after you are discharged, you can call the unit and asked to speak with the hospitalist on call if the hospitalist that took care of you is not available. Once you are discharged, your primary care physician will handle any further medical issues.   Please note that NO REFILLS for any discharge medications will be authorized once you are discharged, as it is imperative that you return to your primary care physician (or establish a relationship with a primary care physician if you do not have one) for your aftercare needs so that they can reassess your need for medications and monitor your lab values.  Please take all your medications with you for your next visit with your Primary MD. Please ask your Primary MD to get all Hospital records sent to his/her office. Please request your Primary MD to go over all hospital test results at the follow up.  If you experience worsening of your admission symptoms, develop shortness of breath, chest pain, suicidal or homicidal thoughts or a life threatening emergency, you must seek medical attention immediately by calling 911 or calling your MD.   Dennis Bast must read the complete instructions/literature along with all the possible adverse reactions/side effects for all the  medicines you take including new medications that have been prescribed to you. Take new medicines after you have completely understood and accpet all the possible adverse reactions/side effects.    Do not drive when taking pain medications or sedatives.     Do not take more than prescribed Pain, Sleep and Anxiety Medications   If you have smoked or chewed Tobacco in the last 2 yrs please stop. Stop any regular alcohol  and or recreational drug use.   Wear Seat belts while driving.

## 2020-09-24 NOTE — ED Notes (Signed)
Attempted to call report and advised someone would call me back

## 2020-09-24 NOTE — Plan of Care (Signed)
Briefly discussed the patient with Dr. Wynelle Cleveland, who has not yet seen the patient but was concerned about initial ED notes that the patient was confused on initial arrival.  Based on Dr. Juel Burrow H&P note the patient was able to describe the fall and the events leading to the fall and she was not concerned about patient's mental status at the time of admission.  We discussed that a brief episode of confusion immediately after a fall is not unusual and likely secondary to mild concussion that is resolving.  Should Dr. Wynelle Cleveland have any concerns about the patient's mental status or hear anything concerning on history when evaluating the patient I am happy to be of further assistance but at this time based on chart review do not think additional neurologic work-up is needed.  Lesleigh Noe MD-PhD [facility] [phone]  Available 7 AM to 7 PM, outside these hours please contact Neurologist on call listed on AMION

## 2020-09-24 NOTE — Progress Notes (Signed)
PT Cancellation Note  Patient Details Name: Claire Carrillo MRN: [id number] DOB: December 18, 1965   Cancelled Treatment:    Reason Eval/Treat Not Completed: Other (comment).  Declines PT stating she is getting around fine.  Will recheck on her if still here tomorrow.   Ramond Dial 09/24/2020, 2:48 PM  Mee Hives, PT MS Acute Rehab Dept. Number: [place]

## 2020-09-24 NOTE — Plan of Care (Signed)
  Problem: Education: Goal: Knowledge of General Education information will improve Description: Including pain rating scale, medication(s)/side effects and non-pharmacologic comfort measures Outcome: Progressing   Problem: Activity: Goal: Risk for activity intolerance will decrease Outcome: Progressing   Problem: Coping: Goal: Level of anxiety will decrease Outcome: Progressing   

## 2020-09-24 NOTE — Progress Notes (Addendum)
Neuro consult, MRI, EEG ordered for fall with subsequent memory loss and confusion.   Neuro had called me and feels the patient had a mild concussion. I have held off on further work up.

## 2020-09-26 LAB — URINE CULTURE: Culture: >=100000 — AB

## 2021-10-20 ENCOUNTER — Other Ambulatory Visit: Payer: Self-pay

## 2021-10-20 DIAGNOSIS — Z1231 Encounter for screening mammogram for malignant neoplasm of breast: Secondary | ICD-10-CM

## 2021-10-21 ENCOUNTER — Telehealth: Payer: Self-pay | Admitting: *Deleted

## 2021-10-21 ENCOUNTER — Ambulatory Visit: Payer: Self-pay | Attending: Family Medicine

## 2021-12-29 ENCOUNTER — Ambulatory Visit: Payer: Self-pay | Attending: Hematology and Oncology

## 2022-07-08 ENCOUNTER — Other Ambulatory Visit: Payer: Self-pay | Admitting: Family Medicine

## 2022-07-08 DIAGNOSIS — Z1231 Encounter for screening mammogram for malignant neoplasm of breast: Secondary | ICD-10-CM

## 2022-07-17 ENCOUNTER — Encounter: Payer: Self-pay | Admitting: *Deleted

## 2022-08-06 ENCOUNTER — Ambulatory Visit: Payer: BC Managed Care – PPO | Admitting: Podiatry

## 2022-08-13 ENCOUNTER — Ambulatory Visit: Payer: BC Managed Care – PPO | Admitting: Podiatry

## 2022-10-24 ENCOUNTER — Other Ambulatory Visit: Payer: Self-pay

## 2022-10-24 ENCOUNTER — Emergency Department: Payer: BC Managed Care – PPO

## 2022-10-24 ENCOUNTER — Emergency Department
Admission: EM | Admit: 2022-10-24 | Discharge: 2022-10-25 | Disposition: A | Discharge status: Home / Self Care | Payer: BC Managed Care – PPO | Attending: Emergency Medicine | Admitting: Emergency Medicine

## 2022-10-24 DIAGNOSIS — R4182 Altered mental status, unspecified: Secondary | ICD-10-CM

## 2022-10-24 DIAGNOSIS — W19XXXA Unspecified fall, initial encounter: Secondary | ICD-10-CM | POA: Insufficient documentation

## 2022-10-24 DIAGNOSIS — I10 Essential (primary) hypertension: Secondary | ICD-10-CM | POA: Insufficient documentation

## 2022-10-24 DIAGNOSIS — R519 Headache, unspecified: Secondary | ICD-10-CM | POA: Diagnosis not present

## 2022-10-24 DIAGNOSIS — Y92009 Unspecified place in unspecified non-institutional (private) residence as the place of occurrence of the external cause: Secondary | ICD-10-CM | POA: Insufficient documentation

## 2022-10-24 DIAGNOSIS — E119 Type 2 diabetes mellitus without complications: Secondary | ICD-10-CM | POA: Insufficient documentation

## 2022-10-24 MED ORDER — ACETAMINOPHEN 500 MG PO TABS
1000.0000 mg | ORAL_TABLET | Freq: Once | ORAL | Status: AC
  Administered 2022-10-25: 1000 mg via ORAL
  Filled 2022-10-24: qty 2

## 2022-10-24 NOTE — ED Triage Notes (Signed)
Per EMS pt heard a loud noise and found pt on the ground outside. Pt was disoriented x4 otherwise neurologically intact. Per husband -blood thinner, DM2, HTN, and infection in R eye causing redness. Pt only c/o is pain on L side of head. Pt is unable to tell us anything else.

## 2022-10-24 NOTE — ED Provider Notes (Signed)
[facility] Provider Note    Event Date/Time   First MD Initiated Contact with Patient 10/24/22 2345     (approximate)   History   No chief complaint on file.   HPI  Claire Carrillo is a 57 y.o. female  ***   History provided by ***.    No past medical history on file.  *** The histories are not reviewed yet. Please review them in the "History" navigator section and refresh this SmartLink.  MEDICATIONS:  Prior to Admission medications   Not on File    Physical Exam   Triage Vital Signs: ED Triage Vitals  Encounter Vitals Group     BP      Systolic BP Percentile      Diastolic BP Percentile      Pulse      Resp      Temp      Temp src      SpO2      Weight      Height      Head Circumference      Peak Flow      Pain Score      Pain Loc      Pain Education      Exclude from Growth Chart     Most recent vital signs: There were no vitals filed for this visit.   CONSTITUTIONAL: Alert, responds appropriately to questions. Well-appearing; well-nourished; GCS 15 HEAD: Normocephalic; atraumatic EYES: Conjunctivae clear, PERRL, EOMI ENT: normal nose; no rhinorrhea; moist mucous membranes; pharynx without lesions noted; no dental injury; no septal hematoma, no epistaxis; no facial deformity or bony tenderness NECK: Supple, no midline spinal tenderness, step-off or deformity; trachea midline CARD: RRR; S1 and S2 appreciated; no murmurs, no clicks, no rubs, no gallops RESP: Normal chest excursion without splinting or tachypnea; breath sounds clear and equal bilaterally; no wheezes, no rhonchi, no rales; no hypoxia or respiratory distress CHEST:  chest wall stable, no crepitus or ecchymosis or deformity, nontender to palpation; no flail chest ABD/GI: Non-distended; soft, non-tender, no rebound, no guarding; no ecchymosis or other lesions noted PELVIS:  stable, nontender to palpation BACK:  The back appears normal; no midline spinal  tenderness, step-off or deformity EXT: Normal ROM in all joints; no edema; normal capillary refill; no cyanosis, no bony tenderness or bony deformity of patient's extremities, no joint effusions, compartments are soft, extremities are warm and well-perfused, no ecchymosis SKIN: Normal color for age and race; warm NEURO: No facial asymmetry, normal speech, moving all extremities equally  ED Results / Procedures / Treatments   LABS: (all labs ordered are listed, but only abnormal results are displayed) Labs Reviewed - No data to display   EKG:  EKG Interpretation Date/Time:    Ventricular Rate:    PR Interval:    QRS Duration:    QT Interval:    QTC Calculation:   R Axis:      Text Interpretation:            RADIOLOGY: My personal review and interpretation of imaging:  ***  I have personally reviewed all radiology reports. No results found.   PROCEDURES:  Critical Care performed: {CriticalCareYesNo:19197::"Yes, see critical care procedure note(s)","No"}   CRITICAL CARE Performed by: Rochele Raring   Total critical care time: *** minutes  Critical care time was exclusive of separately billable procedures and treating other patients.  Critical care was necessary to treat or prevent imminent or life-threatening deterioration.  Critical  care was time spent personally by me on the following activities: development of treatment plan with patient and/or surrogate as well as nursing, discussions with consultants, evaluation of patient's response to treatment, examination of patient, obtaining history from patient or surrogate, ordering and performing treatments and interventions, ordering and review of laboratory studies, ordering and review of radiographic studies, pulse oximetry and re-evaluation of patient's condition.   Procedures    IMPRESSION / MDM / ASSESSMENT AND PLAN / ED COURSE  I reviewed the triage vital signs and the nursing notes.  ***  The patient is  on the cardiac monitor to evaluate for evidence of arrhythmia and/or significant heart rate changes.   DIFFERENTIAL DIAGNOSIS (includes but not limited to):   ***  Patient's presentation is most consistent with {EM COPA:27473}  PLAN: ***   MEDICATIONS GIVEN IN ED: Medications - No data to display   ED COURSE:  ***   CONSULTS:  ***   OUTSIDE RECORDS REVIEWED:  ***       FINAL CLINICAL IMPRESSION(S) / ED DIAGNOSES   Final diagnoses:  None     Rx / DC Orders   ED Discharge Orders     None        Note:  This document was prepared using Dragon voice recognition software and may include unintentional dictation errors.

## 2022-10-25 ENCOUNTER — Emergency Department: Payer: BC Managed Care – PPO

## 2022-10-25 ENCOUNTER — Encounter: Payer: Self-pay | Admitting: Emergency Medicine

## 2022-10-25 LAB — SALICYLATE LEVEL: Salicylate Lvl: <7 mg/dL — ABNORMAL LOW (ref 7.0–30.0)

## 2022-10-25 LAB — ETHANOL: Alcohol, Ethyl (B): <10 mg/dL (ref <10)

## 2022-10-25 LAB — ACETAMINOPHEN LEVEL: Acetaminophen (Tylenol), Serum: <10 ug/mL — ABNORMAL LOW (ref 10–30)

## 2022-10-25 LAB — CBC WITH DIFFERENTIAL/PLATELET
Abs Immature Granulocytes: 0.06 10*3/uL (ref 0.00–0.07)
Basophils Absolute: 0.1 10*3/uL (ref 0.0–0.1)
Basophils Relative: 1 %
Eosinophils Absolute: 0.1 10*3/uL (ref 0.0–0.5)
Eosinophils Relative: 1 %
HCT: 42.1 % (ref 36.0–46.0)
Hemoglobin: 13.8 g/dL (ref 12.0–15.0)
Immature Granulocytes: 1 %
Lymphocytes Relative: 45 %
Lymphs Abs: 4.8 10*3/uL — ABNORMAL HIGH (ref 0.7–4.0)
MCH: 27.2 pg (ref 26.0–34.0)
MCHC: 32.8 g/dL (ref 30.0–36.0)
MCV: 83 fL (ref 80.0–100.0)
Monocytes Absolute: 0.7 10*3/uL (ref 0.1–1.0)
Monocytes Relative: 6 %
Neutro Abs: 5.1 10*3/uL (ref 1.7–7.7)
Neutrophils Relative %: 46 %
Platelets: 498 10*3/uL — ABNORMAL HIGH (ref 150–400)
RBC: 5.07 MIL/uL (ref 3.87–5.11)
RDW: 13.7 % (ref 11.5–15.5)
WBC: 10.8 10*3/uL — ABNORMAL HIGH (ref 4.0–10.5)
nRBC: 0 % (ref 0.0–0.2)

## 2022-10-25 LAB — COMPREHENSIVE METABOLIC PANEL WITH GFR
ALT: 14 U/L (ref 0–44)
AST: 12 U/L — ABNORMAL LOW (ref 15–41)
Albumin: 4.6 g/dL (ref 3.5–5.0)
Alkaline Phosphatase: 96 U/L (ref 38–126)
Anion gap: 12 (ref 5–15)
BUN: 20 mg/dL (ref 6–20)
CO2: 25 mmol/L (ref 22–32)
Calcium: 9.8 mg/dL (ref 8.9–10.3)
Chloride: 96 mmol/L — ABNORMAL LOW (ref 98–111)
Creatinine, Ser: 0.86 mg/dL (ref 0.44–1.00)
GFR, Estimated: 45 mL/min — ABNORMAL LOW (ref >60)
Glucose, Bld: 350 mg/dL — ABNORMAL HIGH (ref 70–99)
Potassium: 3.7 mmol/L (ref 3.5–5.1)
Sodium: 133 mmol/L — ABNORMAL LOW (ref 135–145)
Total Bilirubin: 0.7 mg/dL (ref 0.3–1.2)
Total Protein: 9.2 g/dL — ABNORMAL HIGH (ref 6.5–8.1)

## 2022-10-25 LAB — URINALYSIS, ROUTINE W REFLEX MICROSCOPIC
Bacteria, UA: NONE SEEN
Bilirubin Urine: NEGATIVE
Glucose, UA: >=500 mg/dL — AB
Hgb urine dipstick: NEGATIVE
Ketones, ur: NEGATIVE mg/dL
Leukocytes,Ua: NEGATIVE
Nitrite: NEGATIVE
Protein, ur: NEGATIVE mg/dL
Specific Gravity, Urine: 1.02 (ref 1.005–1.030)
pH: 5 (ref 5.0–8.0)

## 2022-10-25 LAB — URINE DRUG SCREEN, QUALITATIVE (ARMC ONLY)
Amphetamines, Ur Screen: NOT DETECTED
Barbiturates, Ur Screen: NOT DETECTED
Benzodiazepine, Ur Scrn: NOT DETECTED
Cannabinoid 50 Ng, Ur ~~LOC~~: NOT DETECTED
Cocaine Metabolite,Ur ~~LOC~~: NOT DETECTED
MDMA (Ecstasy)Ur Screen: NOT DETECTED
Methadone Scn, Ur: NOT DETECTED
Opiate, Ur Screen: NOT DETECTED
Phencyclidine (PCP) Ur S: NOT DETECTED
Tricyclic, Ur Screen: NOT DETECTED

## 2022-10-25 LAB — HCG, QUANTITATIVE, PREGNANCY: hCG, Beta Chain, Quant, S: 6 m[IU]/mL — ABNORMAL HIGH

## 2022-10-25 MED ORDER — SODIUM CHLORIDE 0.9 % IV BOLUS (SEPSIS)
1000.0000 mL | Freq: Once | INTRAVENOUS | Status: AC
  Administered 2022-10-25: 1000 mL via INTRAVENOUS

## 2022-10-25 MED ORDER — PROCHLORPERAZINE EDISYLATE 10 MG/2ML IJ SOLN
10.0000 mg | Freq: Once | INTRAMUSCULAR | Status: AC
  Administered 2022-10-25: 10 mg via INTRAVENOUS
  Filled 2022-10-25: qty 2

## 2022-10-25 MED ORDER — KETOROLAC TROMETHAMINE 30 MG/ML IJ SOLN
30.0000 mg | Freq: Once | INTRAMUSCULAR | Status: AC
  Administered 2022-10-25: 30 mg via INTRAVENOUS
  Filled 2022-10-25: qty 1

## 2022-10-25 NOTE — ED Notes (Signed)
Pt updated on results and plan for d/c by provider. Rn found emergency contacts in merge chart and pt gave approval for me to call the. Lucendia Herrlich (relative) was contacted, updated, and d/c instructions were reviewed with her. She will pick her up. Pt notified.

## 2022-10-25 NOTE — Discharge Instructions (Signed)
Your labs, urine, CT head and cervical spine, MRI brain were normal today.  I recommend close follow-up with a primary care doctor.  Please return to the emergency department for any falls or confusion.   Please go to the following website to schedule new (and existing) patient appointments:   [email or web address]   The following is a list of primary care offices in the area who are accepting new patients at this time.  Please reach out to one of them directly and let them know you would like to schedule an appointment to follow up on an Emergency Department visit, and/or to establish a new primary care provider (PCP).  There are likely other primary care clinics in the are who are accepting new patients, but this is an excellent place to start:  [facility] Lead physician: Dr Shirlee Latch [address] [place], [place] [address] [phone]  [facility] Lead Physician: Dr Alba Cory [address], [place], [place] [address] [phone]  [facility]  Lead Physician: Dr Olevia Perches [address] [place], [place] [address] [phone]  [facility] Lead Physician: Dr Sofie Hartigan [address] [place], [place] [address] [phone]  [facility] at [facility] Lead Physician: Dr Bari Edward [address] [place], [place], [place] [address] [phone]

## 2022-10-25 NOTE — ED Notes (Signed)
Pt updated

## 2022-10-25 NOTE — ED Notes (Signed)
Pt now a&o x 4. Pt states she remembers feeling dizzy when getting up and then fell down. Pt states she remembers the ambulance ride but details are fuzzy. Per pt she doesn't have a husband, but that was her roommate who left for work after ems picked her up. Pt states she is feeling back to normal. MD notified of status change.

## 2022-10-26 ENCOUNTER — Encounter (HOSPITAL_COMMUNITY): Payer: Self-pay
